# Patient Record
Sex: Female | Born: 1985 | Race: Black or African American | Hispanic: No | Marital: Single | Smoking: Never smoker
Health system: Southern US, Community
[De-identification: ages and names within clinical notes are randomized; demographics above are authoritative.]

## PROBLEM LIST (undated history)

## (undated) ENCOUNTER — Inpatient Hospital Stay (HOSPITAL_COMMUNITY): Payer: Self-pay

## (undated) DIAGNOSIS — F32A Depression, unspecified: Secondary | ICD-10-CM

## (undated) DIAGNOSIS — F53 Postpartum depression: Secondary | ICD-10-CM

## (undated) DIAGNOSIS — R87619 Unspecified abnormal cytological findings in specimens from cervix uteri: Secondary | ICD-10-CM

## (undated) DIAGNOSIS — F329 Major depressive disorder, single episode, unspecified: Secondary | ICD-10-CM

## (undated) DIAGNOSIS — F99 Mental disorder, not otherwise specified: Secondary | ICD-10-CM

## (undated) DIAGNOSIS — I10 Essential (primary) hypertension: Secondary | ICD-10-CM

## (undated) DIAGNOSIS — IMO0002 Reserved for concepts with insufficient information to code with codable children: Secondary | ICD-10-CM

## (undated) DIAGNOSIS — O99345 Other mental disorders complicating the puerperium: Secondary | ICD-10-CM

## (undated) DIAGNOSIS — E0590 Thyrotoxicosis, unspecified without thyrotoxic crisis or storm: Secondary | ICD-10-CM

## (undated) HISTORY — DX: Postpartum depression: F53.0

## (undated) HISTORY — DX: Other mental disorders complicating the puerperium: O99.345

---
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
## 2007-08-08 ENCOUNTER — Emergency Department (HOSPITAL_COMMUNITY): Admission: EM | Admit: 2007-08-08 | Discharge: 2007-08-08 | Payer: Self-pay | Admitting: Emergency Medicine

## 2008-10-17 ENCOUNTER — Ambulatory Visit (HOSPITAL_COMMUNITY): Admission: RE | Admit: 2008-10-17 | Discharge: 2008-10-17 | Payer: Self-pay | Admitting: Obstetrics

## 2008-12-17 ENCOUNTER — Ambulatory Visit (HOSPITAL_COMMUNITY): Admission: RE | Admit: 2008-12-17 | Discharge: 2008-12-17 | Payer: Self-pay | Admitting: Obstetrics

## 2009-01-24 ENCOUNTER — Inpatient Hospital Stay (HOSPITAL_COMMUNITY): Admission: AD | Admit: 2009-01-24 | Discharge: 2009-01-24 | Payer: Self-pay | Admitting: Obstetrics & Gynecology

## 2009-02-12 ENCOUNTER — Inpatient Hospital Stay (HOSPITAL_COMMUNITY): Admission: AD | Admit: 2009-02-12 | Discharge: 2009-02-12 | Payer: Self-pay | Admitting: Obstetrics

## 2009-02-15 ENCOUNTER — Ambulatory Visit (HOSPITAL_COMMUNITY): Admission: RE | Admit: 2009-02-15 | Discharge: 2009-02-15 | Payer: Self-pay | Admitting: Obstetrics

## 2009-02-19 ENCOUNTER — Inpatient Hospital Stay (HOSPITAL_COMMUNITY): Admission: AD | Admit: 2009-02-19 | Discharge: 2009-02-19 | Payer: Self-pay | Admitting: Obstetrics

## 2009-03-07 ENCOUNTER — Inpatient Hospital Stay (HOSPITAL_COMMUNITY): Admission: AD | Admit: 2009-03-07 | Discharge: 2009-03-09 | Payer: Self-pay | Admitting: Obstetrics

## 2009-03-14 ENCOUNTER — Inpatient Hospital Stay (HOSPITAL_COMMUNITY): Admission: RE | Admit: 2009-03-14 | Discharge: 2009-03-18 | Payer: Self-pay | Admitting: Obstetrics & Gynecology

## 2009-03-15 ENCOUNTER — Encounter: Payer: Self-pay | Admitting: Obstetrics

## 2010-01-19 ENCOUNTER — Emergency Department (HOSPITAL_COMMUNITY): Admission: EM | Admit: 2010-01-19 | Discharge: 2010-01-19 | Payer: Self-pay | Admitting: Emergency Medicine

## 2010-04-20 IMAGING — US US OB FOLLOW-UP
1 series · 13 of 13 positions shown · non-contrast
Comparison: none

OBSTETRICAL ULTRASOUND:
 This ultrasound exam was performed in the [HOSPITAL] Ultrasound Department.  The OB US report was generated in the AS system, and faxed to the ordering physician.  This report is also available in [HOSPITAL]?s Access[employer]ware and in [REDACTED] PACS.

[Series 1: us ob follow up · 0.40mm/px · 13 of 13 slices shown]
[im 1/13]
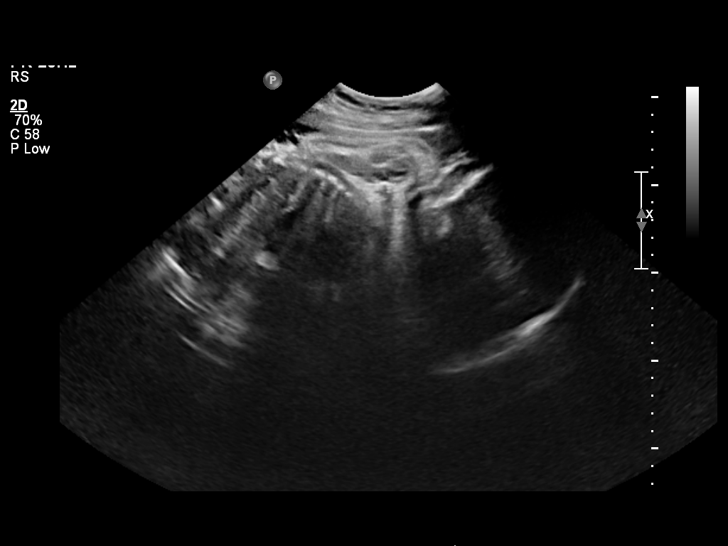
[im 2/13]
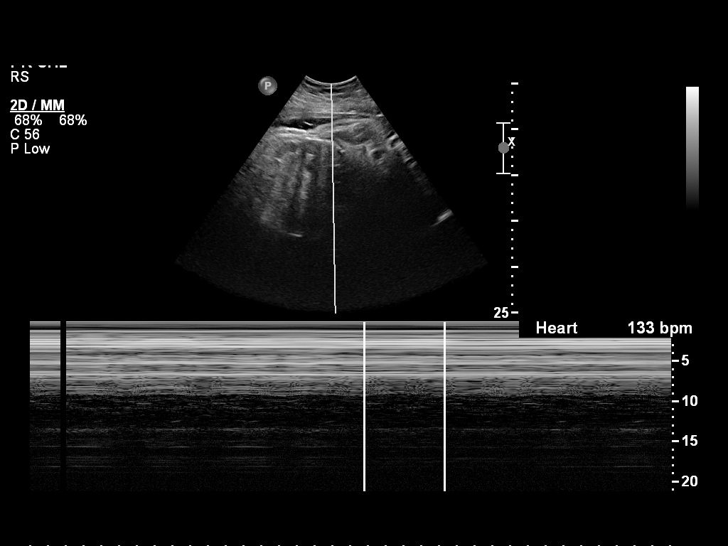
[im 3/13]
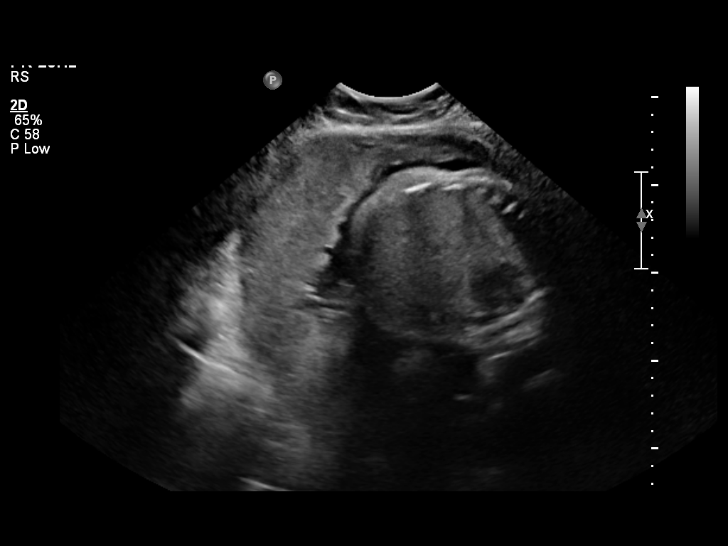
[im 4/13]
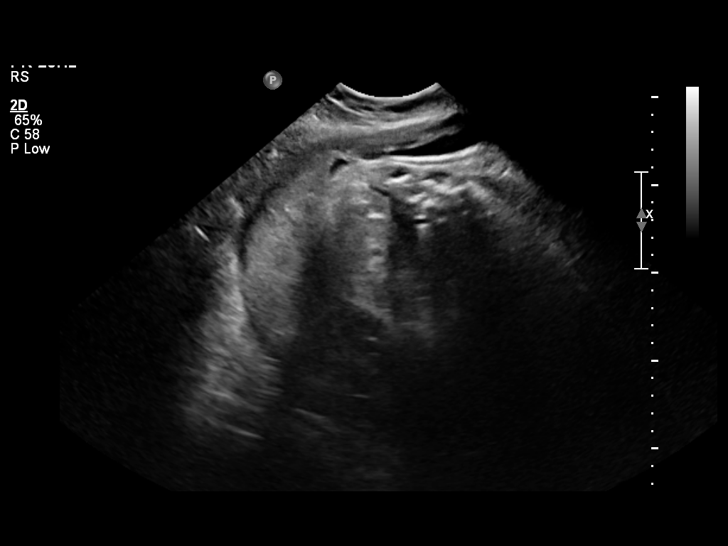
[im 5/13]
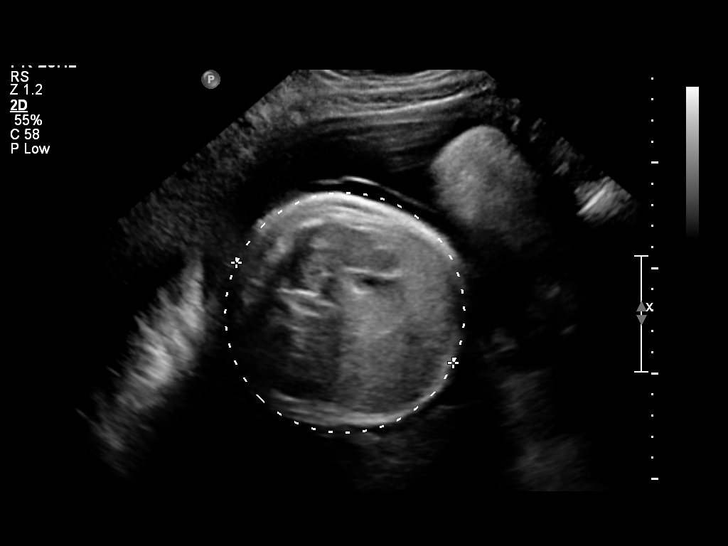
[im 6/13]
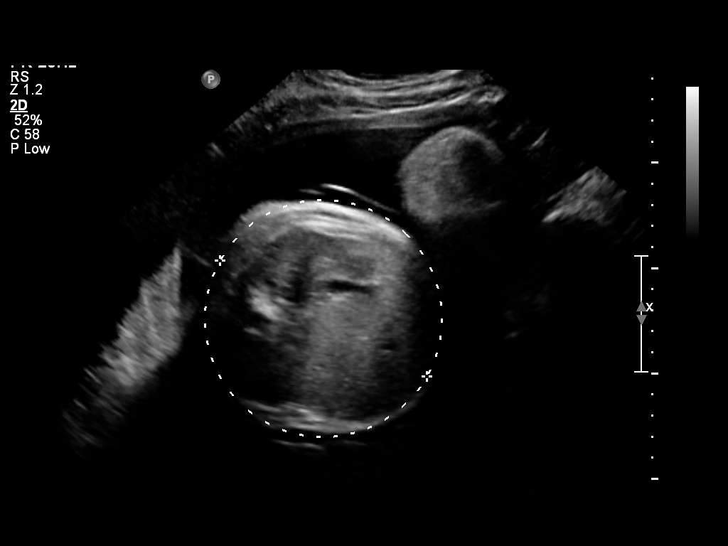
[im 7/13]
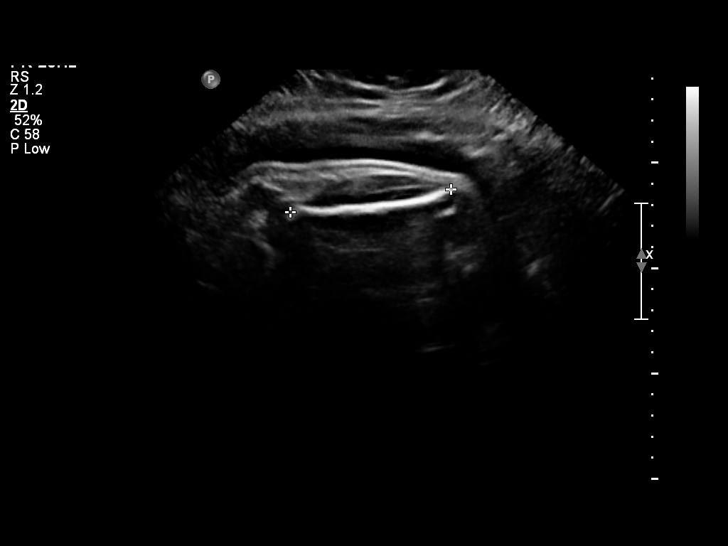
[im 8/13]
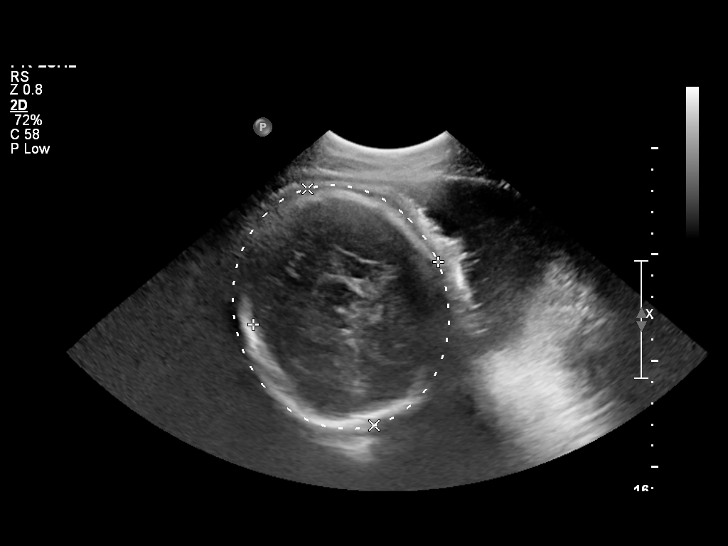
[im 9/13]
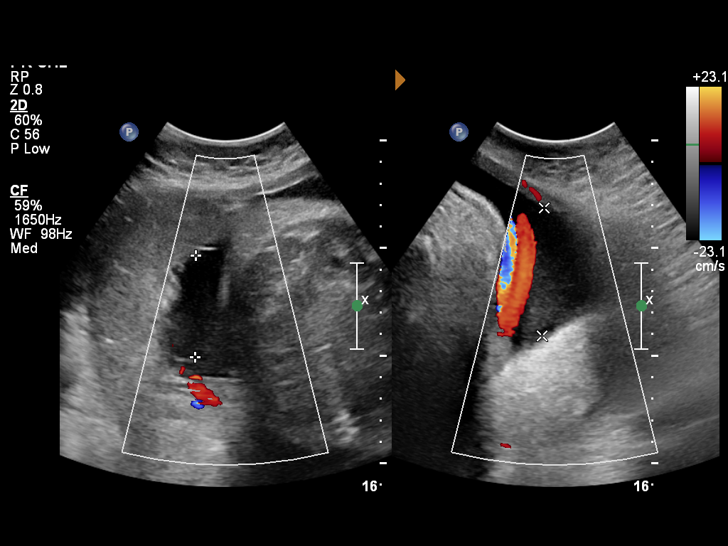
[im 10/13]
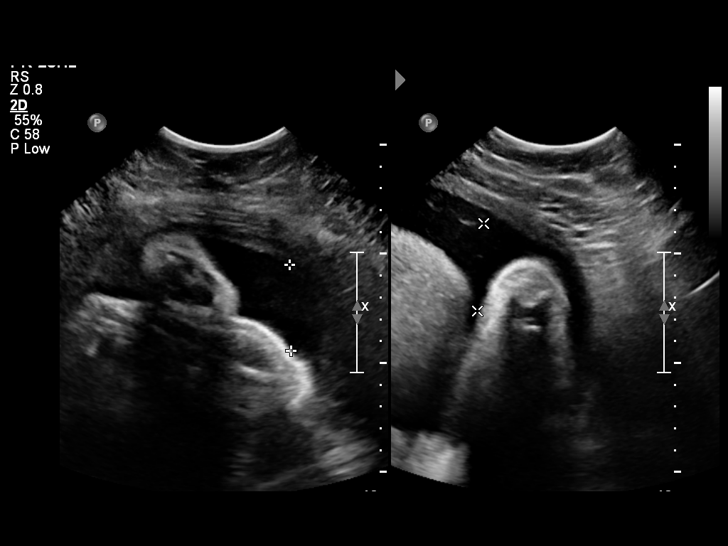
[im 11/13]
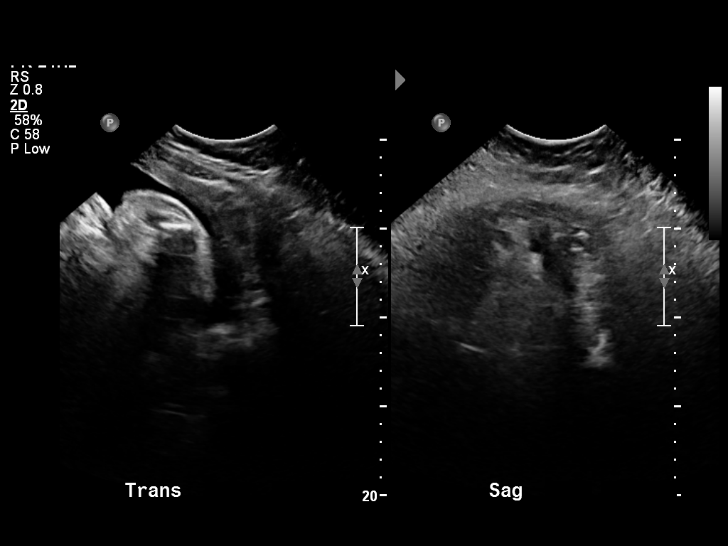
[im 12/13]
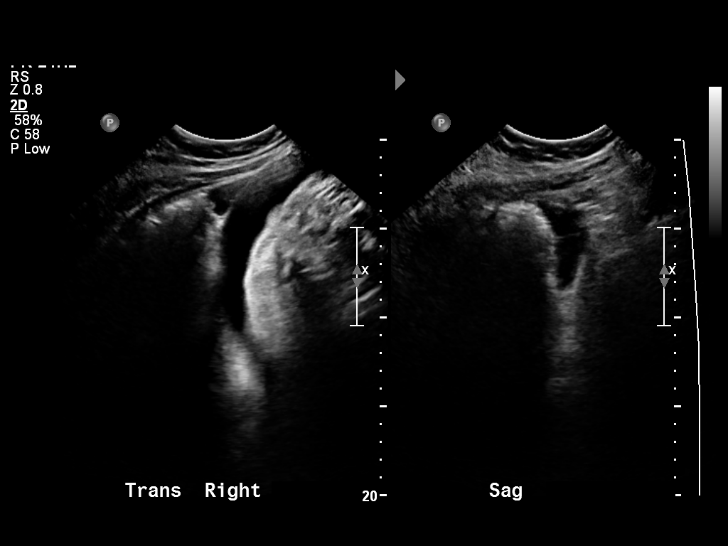
[im 13/13]
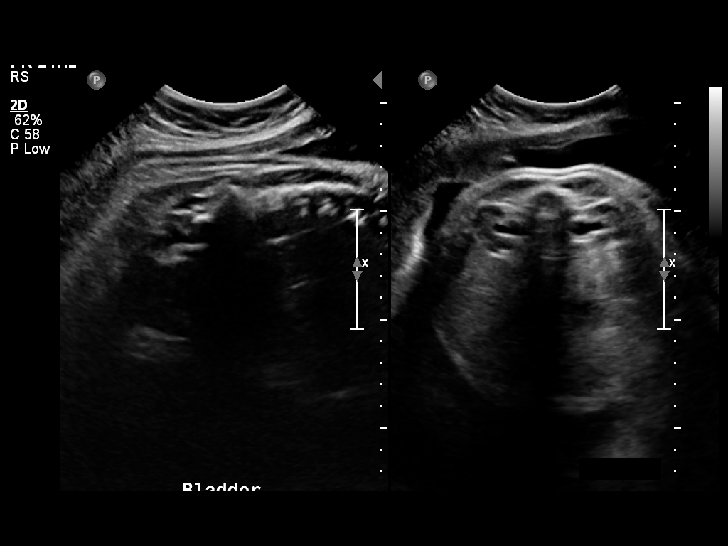

[13 of 13 positions shown; findings below may reference images not displayed]

IMPRESSION: See AS Obstetric US report.

## 2010-07-31 LAB — GLUCOSE, CAPILLARY: Glucose-Capillary: 90 mg/dL (ref 70–99)

## 2010-08-21 LAB — CBC
HCT: 31.5 % — ABNORMAL LOW (ref 36.0–46.0)
HCT: 39.3 % (ref 36.0–46.0)
HCT: 40.3 % (ref 36.0–46.0)
HCT: 40.4 % (ref 36.0–46.0)
Hemoglobin: 10.6 g/dL — ABNORMAL LOW (ref 12.0–15.0)
Hemoglobin: 13.2 g/dL (ref 12.0–15.0)
Hemoglobin: 13.6 g/dL (ref 12.0–15.0)
Hemoglobin: 13.5 g/dL (ref 12.0–15.0)
MCHC: 33.7 g/dL (ref 30.0–36.0)
MCHC: 33.7 g/dL (ref 30.0–36.0)
MCHC: 33.8 g/dL (ref 30.0–36.0)
MCHC: 33.3 g/dL (ref 30.0–36.0)
MCV: 86.9 fL (ref 78.0–100.0)
MCV: 87.1 fL (ref 78.0–100.0)
MCV: 87.9 fL (ref 78.0–100.0)
MCV: 87.2 fL (ref 78.0–100.0)
Platelets: 149 10*3/uL — ABNORMAL LOW (ref 150–400)
Platelets: 193 10*3/uL (ref 150–400)
Platelets: 194 10*3/uL (ref 150–400)
Platelets: 209 10*3/uL (ref 150–400)
RBC: 3.58 MIL/uL — ABNORMAL LOW (ref 3.87–5.11)
RBC: 4.52 MIL/uL (ref 3.87–5.11)
RBC: 4.63 MIL/uL (ref 3.87–5.11)
RBC: 4.64 MIL/uL (ref 3.87–5.11)
RDW: 14.2 % (ref 11.5–15.5)
RDW: 14.3 % (ref 11.5–15.5)
RDW: 14.5 % (ref 11.5–15.5)
RDW: 14.7 % (ref 11.5–15.5)
WBC: 12.8 10*3/uL — ABNORMAL HIGH (ref 4.0–10.5)
WBC: 15.1 10*3/uL — ABNORMAL HIGH (ref 4.0–10.5)
WBC: 17.2 10*3/uL — ABNORMAL HIGH (ref 4.0–10.5)
WBC: 11.1 10*3/uL — ABNORMAL HIGH (ref 4.0–10.5)

## 2010-08-21 LAB — COMPREHENSIVE METABOLIC PANEL
ALT: 13 U/L (ref 0–35)
ALT: 17 U/L (ref 0–35)
AST: 21 U/L (ref 0–37)
AST: 25 U/L (ref 0–37)
Albumin: 2.6 g/dL — ABNORMAL LOW (ref 3.5–5.2)
Albumin: 2.6 g/dL — ABNORMAL LOW (ref 3.5–5.2)
Alkaline Phosphatase: 137 U/L — ABNORMAL HIGH (ref 39–117)
Alkaline Phosphatase: 133 U/L — ABNORMAL HIGH (ref 39–117)
BUN: 4 mg/dL — ABNORMAL LOW (ref 6–23)
BUN: 5 mg/dL — ABNORMAL LOW (ref 6–23)
CO2: 23 mEq/L (ref 19–32)
CO2: 21 mEq/L (ref 19–32)
Calcium: 9.4 mg/dL (ref 8.4–10.5)
Calcium: 9 mg/dL (ref 8.4–10.5)
Chloride: 105 mEq/L (ref 96–112)
Chloride: 105 mEq/L (ref 96–112)
Creatinine, Ser: 0.56 mg/dL (ref 0.4–1.2)
Creatinine, Ser: 0.64 mg/dL (ref 0.4–1.2)
GFR calc Af Amer: >60 mL/min (ref >60)
GFR calc Af Amer: >60 mL/min (ref >60)
GFR calc non Af Amer: >60 mL/min (ref >60)
GFR calc non Af Amer: >60 mL/min (ref >60)
Glucose, Bld: 102 mg/dL — ABNORMAL HIGH (ref 70–99)
Glucose, Bld: 85 mg/dL (ref 70–99)
Potassium: 3.9 mEq/L (ref 3.5–5.1)
Potassium: 3.8 mEq/L (ref 3.5–5.1)
Sodium: 135 mEq/L (ref 135–145)
Sodium: 135 mEq/L (ref 135–145)
Total Bilirubin: 0.5 mg/dL (ref 0.3–1.2)
Total Bilirubin: 0.7 mg/dL (ref 0.3–1.2)
Total Protein: 5.9 g/dL — ABNORMAL LOW (ref 6.0–8.3)
Total Protein: 5.9 g/dL — ABNORMAL LOW (ref 6.0–8.3)

## 2010-08-21 LAB — URINE MICROSCOPIC-ADD ON

## 2010-08-21 LAB — URINALYSIS, ROUTINE W REFLEX MICROSCOPIC
Glucose, UA: NEGATIVE mg/dL
Ketones, ur: >80 mg/dL — AB
Nitrite: NEGATIVE
Protein, ur: 100 mg/dL — AB
Specific Gravity, Urine: 1.025 (ref 1.005–1.030)
Urobilinogen, UA: 1 mg/dL (ref 0.0–1.0)
pH: 6 (ref 5.0–8.0)

## 2010-08-21 LAB — URIC ACID: Uric Acid, Serum: 5.5 mg/dL (ref 2.4–7.0)

## 2010-08-21 LAB — URINALYSIS, DIPSTICK ONLY
Bilirubin Urine: NEGATIVE
Glucose, UA: NEGATIVE mg/dL
Ketones, ur: NEGATIVE mg/dL
Nitrite: NEGATIVE
Protein, ur: NEGATIVE mg/dL
Specific Gravity, Urine: <1.005 — ABNORMAL LOW (ref 1.005–1.030)
Urobilinogen, UA: 0.2 mg/dL (ref 0.0–1.0)
pH: 6.5 (ref 5.0–8.0)

## 2010-08-21 LAB — RPR
RPR Ser Ql: NONREACTIVE
RPR Ser Ql: NONREACTIVE

## 2010-08-21 LAB — LACTATE DEHYDROGENASE: LDH: 156 U/L (ref 94–250)

## 2010-08-22 LAB — URINALYSIS, ROUTINE W REFLEX MICROSCOPIC
Bilirubin Urine: NEGATIVE
Bilirubin Urine: NEGATIVE
Glucose, UA: NEGATIVE mg/dL
Glucose, UA: NEGATIVE mg/dL
Ketones, ur: NEGATIVE mg/dL
Ketones, ur: 15 mg/dL — AB
Nitrite: NEGATIVE
Protein, ur: NEGATIVE mg/dL
Protein, ur: NEGATIVE mg/dL
Specific Gravity, Urine: 1.015 (ref 1.005–1.030)
Urobilinogen, UA: 0.2 mg/dL (ref 0.0–1.0)
pH: 6.5 (ref 5.0–8.0)
pH: 6 (ref 5.0–8.0)

## 2010-08-22 LAB — COMPREHENSIVE METABOLIC PANEL
AST: 24 U/L (ref 0–37)
Albumin: 2.5 g/dL — ABNORMAL LOW (ref 3.5–5.2)
Albumin: 2.4 g/dL — ABNORMAL LOW (ref 3.5–5.2)
Alkaline Phosphatase: 135 U/L — ABNORMAL HIGH (ref 39–117)
Alkaline Phosphatase: 125 U/L — ABNORMAL HIGH (ref 39–117)
BUN: 1 mg/dL — ABNORMAL LOW (ref 6–23)
BUN: 2 mg/dL — ABNORMAL LOW (ref 6–23)
CO2: 20 mEq/L (ref 19–32)
CO2: 22 mEq/L (ref 19–32)
Chloride: 107 mEq/L (ref 96–112)
Chloride: 105 mEq/L (ref 96–112)
Creatinine, Ser: 0.51 mg/dL (ref 0.4–1.2)
Creatinine, Ser: 0.55 mg/dL (ref 0.4–1.2)
GFR calc Af Amer: >60 mL/min (ref >60)
GFR calc non Af Amer: >60 mL/min (ref >60)
GFR calc non Af Amer: >60 mL/min (ref >60)
Glucose, Bld: 130 mg/dL — ABNORMAL HIGH (ref 70–99)
Potassium: 4.3 mEq/L (ref 3.5–5.1)
Potassium: 3.7 mEq/L (ref 3.5–5.1)
Total Bilirubin: 0.2 mg/dL — ABNORMAL LOW (ref 0.3–1.2)
Total Bilirubin: 0.4 mg/dL (ref 0.3–1.2)

## 2010-08-22 LAB — URINE MICROSCOPIC-ADD ON

## 2010-08-22 LAB — CBC
HCT: 39.9 % (ref 36.0–46.0)
HCT: 39.7 % (ref 36.0–46.0)
Hemoglobin: 13.4 g/dL (ref 12.0–15.0)
MCV: 87.3 fL (ref 78.0–100.0)
MCV: 88.1 fL (ref 78.0–100.0)
Platelets: 217 10*3/uL (ref 150–400)
RBC: 4.57 MIL/uL (ref 3.87–5.11)
RBC: 4.51 MIL/uL (ref 3.87–5.11)
WBC: 14.1 10*3/uL — ABNORMAL HIGH (ref 4.0–10.5)
WBC: 13.9 10*3/uL — ABNORMAL HIGH (ref 4.0–10.5)

## 2010-09-04 ENCOUNTER — Emergency Department (HOSPITAL_COMMUNITY)
Admission: EM | Admit: 2010-09-04 | Discharge: 2010-09-04 | Disposition: A | Discharge status: Home / Self Care | Payer: BC Managed Care – PPO | Attending: Emergency Medicine | Admitting: Emergency Medicine

## 2010-09-04 ENCOUNTER — Emergency Department (HOSPITAL_COMMUNITY): Payer: BC Managed Care – PPO

## 2010-09-04 DIAGNOSIS — R0789 Other chest pain: Secondary | ICD-10-CM | POA: Insufficient documentation

## 2010-09-04 LAB — CBC
HCT: 40.2 % (ref 36.0–46.0)
Hemoglobin: 13.6 g/dL (ref 12.0–15.0)
MCHC: 33.8 g/dL (ref 30.0–36.0)

## 2010-09-04 LAB — DIFFERENTIAL
Basophils Absolute: 0 10*3/uL (ref 0.0–0.1)
Lymphocytes Relative: 35 % (ref 12–46)
Monocytes Absolute: 0.7 10*3/uL (ref 0.1–1.0)
Monocytes Relative: 6 % (ref 3–12)
Neutro Abs: 7.6 10*3/uL (ref 1.7–7.7)

## 2010-09-04 LAB — BASIC METABOLIC PANEL
CO2: 25 mEq/L (ref 19–32)
Calcium: 9 mg/dL (ref 8.4–10.5)
Chloride: 105 mEq/L (ref 96–112)
Glucose, Bld: 111 mg/dL — ABNORMAL HIGH (ref 70–99)
Sodium: 137 mEq/L (ref 135–145)

## 2010-09-04 LAB — POCT CARDIAC MARKERS
CKMB, poc: 1.6 ng/mL (ref 1.0–8.0)
Troponin i, poc: <0.05 ng/mL (ref 0.00–0.09)

## 2010-09-04 LAB — URINALYSIS, ROUTINE W REFLEX MICROSCOPIC
Glucose, UA: NEGATIVE mg/dL
Nitrite: NEGATIVE
Protein, ur: NEGATIVE mg/dL
Urobilinogen, UA: 0.2 mg/dL (ref 0.0–1.0)

## 2010-09-04 LAB — POCT PREGNANCY, URINE: Preg Test, Ur: NEGATIVE

## 2011-02-09 LAB — POCT PREGNANCY, URINE: Preg Test, Ur: NEGATIVE

## 2011-02-09 LAB — WET PREP, GENITAL: Clue Cells Wet Prep HPF POC: NONE SEEN

## 2011-02-09 LAB — POCT URINALYSIS DIP (DEVICE)
Hgb urine dipstick: NEGATIVE
Nitrite: NEGATIVE
Protein, ur: NEGATIVE
Specific Gravity, Urine: <=1.005
Urobilinogen, UA: 0.2
pH: 5.5

## 2011-03-05 LAB — POCT PREGNANCY, URINE: Operator id: 270961

## 2011-03-05 LAB — POCT URINALYSIS DIP (DEVICE)
Operator id: 270961
Protein, ur: NEGATIVE
Specific Gravity, Urine: >=1.03
Urobilinogen, UA: 0.2
pH: 5.5

## 2011-03-05 LAB — POCT RAPID STREP A: Streptococcus, Group A Screen (Direct): NEGATIVE

## 2011-04-24 ENCOUNTER — Emergency Department (HOSPITAL_COMMUNITY)
Admission: EM | Admit: 2011-04-24 | Discharge: 2011-04-24 | Discharge status: Against Medical Advice | Payer: Self-pay | Source: Home / Self Care

## 2011-04-25 ENCOUNTER — Emergency Department (HOSPITAL_COMMUNITY)
Admission: EM | Admit: 2011-04-25 | Discharge: 2011-04-25 | Disposition: A | Discharge status: Home / Self Care | Payer: Self-pay | Attending: Emergency Medicine | Admitting: Emergency Medicine

## 2011-04-25 DIAGNOSIS — R1110 Vomiting, unspecified: Secondary | ICD-10-CM | POA: Insufficient documentation

## 2011-04-25 DIAGNOSIS — R059 Cough, unspecified: Secondary | ICD-10-CM | POA: Insufficient documentation

## 2011-04-25 DIAGNOSIS — R509 Fever, unspecified: Secondary | ICD-10-CM | POA: Insufficient documentation

## 2011-04-25 DIAGNOSIS — J111 Influenza due to unidentified influenza virus with other respiratory manifestations: Secondary | ICD-10-CM

## 2011-04-25 DIAGNOSIS — R05 Cough: Secondary | ICD-10-CM | POA: Insufficient documentation

## 2011-04-25 DIAGNOSIS — IMO0001 Reserved for inherently not codable concepts without codable children: Secondary | ICD-10-CM | POA: Insufficient documentation

## 2011-04-25 MED ORDER — IBUPROFEN 800 MG PO TABS
800.0000 mg | ORAL_TABLET | Freq: Once | ORAL | Status: AC
  Administered 2011-04-25: 800 mg via ORAL
  Filled 2011-04-25: qty 1

## 2011-04-25 MED ORDER — IBUPROFEN 600 MG PO TABS: 600.0000 mg | ORAL_TABLET | Freq: Four times a day (QID) | ORAL | Status: AC | PRN

## 2011-04-25 NOTE — ED Notes (Signed)
Pt complains of body aches since yesterday, decreased po intake, fever, chills.

## 2011-04-25 NOTE — ED Provider Notes (Signed)
History     CSN: [id number] Arrival date & time: 04/25/2011  4:34 AM   First MD Initiated Contact with Patient 04/25/11 [phone]      Chief Complaint  Patient presents with  . Generalized Body Aches    (Consider location/radiation/quality/duration/timing/severity/associated sxs/prior treatment) HPI Patient presents with complaint of fever and body aches which began yesterday. She states she took Tylenol which did help somewhat. She has a mild nonproductive cough as well. She's continued to drink fluid. She had one episode of emesis but no diarrhea and has tolerated fluid since then. She has no significant sick contacts. She has no other associated systemic symptoms. She called her physician who requested she come to the ED to find out if she had the flu.  History reviewed. No pertinent past medical history.  History reviewed. No pertinent past surgical history.  History reviewed. No pertinent family history.  History  Substance Use Topics  . Smoking status: Never Smoker   . Smokeless tobacco: Not on file  . Alcohol Use: No    OB History    Grav Para Term Preterm Abortions TAB SAB Ect Mult Living                  Review of Systems ROS reviewed and otherwise negative except for mentioned in HPI  Allergies  Penicillins and Percocet  Home Medications   Current Outpatient Rx  Name Route Sig Dispense Refill  . IBUPROFEN 600 MG PO TABS Oral Take 1 tablet (600 mg total) by mouth every 6 (six) hours as needed for pain. 30 tablet 0    BP 119/75  Pulse 105  Temp(Src) 98.7 F (37.1 C) (Oral)  Resp 20  SpO2 97%  LMP 04/15/2011 Vitals reviewed Physical Exam Physical Examination: General appearance - alert, well appearing, and in no distress Mental status - alert, oriented to person, place, and time Mouth - mucous membranes moist, pharynx normal without lesions Neck - supple, no significant adenopathy Chest - clear to auscultation, no wheezes, rales or rhonchi, symmetric  air entry Heart - normal rate, regular rhythm, normal S1, S2, no murmurs, rubs, clicks or gallops Musculoskeletal - no joint tenderness, deformity or swelling Extremities - peripheral pulses normal, no pedal edema, brisk cap refill Skin - normal coloration and turgor, no rashes  ED Course  Procedures (including critical care time)  Labs Reviewed - No data to display No results found.   1. Influenza-like illness       MDM  Patient presenting with fever and influenza-like illness. She appears well-hydrated and nontoxic. Her vitals are reassuring with the exception of mild tachycardia however patient is well-appearing. Have given her ibuprofen. Explained symptomatic treatment home and the importance of hydration. She was discharged with strict return precautions and is agreeable with this plan the        Ethelda Chick, MD 04/25/11 [phone]

## 2011-08-17 ENCOUNTER — Encounter (HOSPITAL_COMMUNITY): Payer: Self-pay | Admitting: *Deleted

## 2011-08-17 ENCOUNTER — Emergency Department (INDEPENDENT_AMBULATORY_CARE_PROVIDER_SITE_OTHER)
Admission: EM | Admit: 2011-08-17 | Discharge: 2011-08-17 | Disposition: A | Discharge status: Home / Self Care | Payer: Self-pay | Source: Home / Self Care | Attending: Family Medicine | Admitting: Family Medicine

## 2011-08-17 DIAGNOSIS — J029 Acute pharyngitis, unspecified: Secondary | ICD-10-CM

## 2011-08-17 MED ORDER — IBUPROFEN 800 MG PO TABS: 800.0000 mg | ORAL_TABLET | Freq: Three times a day (TID) | ORAL | Status: AC | PRN

## 2011-08-17 MED ORDER — AZITHROMYCIN 250 MG PO TABS: 250.0000 mg | ORAL_TABLET | Freq: Every day | ORAL | Status: AC

## 2011-08-17 NOTE — ED Notes (Signed)
Pt c/o sorethroat onset 3 days ago.  States tonsils swollen.  Denies fever, earache, bodyaches.  Throat red, tonsils swollen and some exudate noted.  Pt has "thick voice".

## 2011-08-17 NOTE — ED Provider Notes (Signed)
History     CSN: [id number]  Arrival date & time 08/17/11  5621   First MD Initiated Contact with Patient 08/17/11 [phone]      Chief Complaint  Patient presents with  . Sore Throat    (Consider location/radiation/quality/duration/timing/severity/associated sxs/prior treatment) HPI Comments: Carrie Massey presents for evaluation of a sore throat, over the last 3 days. She denies any fever. She does report a mildly, productive cough. She does report a change in her voice. She reports a history of strep throat twice in her life. She denies any allergies. She reports pain with swallowing. She does note exudates on her tonsils.  Patient is a 26 y.o. female presenting with pharyngitis. The history is provided by the patient.  Sore Throat This is a new problem. The current episode started more than 2 days ago. The problem occurs constantly. The problem has not changed since onset.The symptoms are aggravated by swallowing, eating and drinking. The symptoms are relieved by nothing. She has tried nothing for the symptoms.    History reviewed. No pertinent past medical history.  Past Surgical History  Procedure Date  . Cesarean section     No family history on file.  History  Substance Use Topics  . Smoking status: Never Smoker   . Smokeless tobacco: Not on file  . Alcohol Use: No    OB History    Grav Para Term Preterm Abortions TAB SAB Ect Mult Living                  Review of Systems  Constitutional: Negative.  Negative for fever and chills.  HENT: Positive for sore throat, trouble swallowing and voice change. Negative for congestion and rhinorrhea.   Eyes: Negative.   Respiratory: Positive for cough.   Cardiovascular: Negative.   Gastrointestinal: Negative.   Genitourinary: Negative.   Musculoskeletal: Negative.   Skin: Negative.   Neurological: Negative.     Allergies  Penicillins and Percocet  Home Medications   Current Outpatient Rx  Name Route Sig Dispense  Refill  . AZITHROMYCIN 250 MG PO TABS Oral Take 1 tablet (250 mg total) by mouth daily. Take two tablets on first day, then one tablet each day for four days 6 tablet 0  . IBUPROFEN 800 MG PO TABS Oral Take 1 tablet (800 mg total) by mouth every 8 (eight) hours as needed for pain or fever. 30 tablet 0    BP 149/106  Pulse 85  Temp(Src) 98.9 F (37.2 C) (Oral)  Resp 14  SpO2 98%  LMP 07/19/2011  Physical Exam  Nursing note and vitals reviewed. Constitutional: She is oriented to person, place, and time. She appears well-developed and well-nourished.  HENT:  Head: Normocephalic and atraumatic.  Right Ear: Tympanic membrane normal.  Left Ear: Tympanic membrane normal.  Mouth/Throat: Uvula is midline and mucous membranes are normal. Oropharyngeal exudate and posterior oropharyngeal erythema present. No tonsillar abscesses.    Eyes: EOM are normal.  Neck: Normal range of motion.  Cardiovascular: Normal rate, regular rhythm, S1 normal, S2 normal and normal heart sounds.   No murmur heard. Pulmonary/Chest: Effort normal and breath sounds normal. She has no wheezes. She has no rhonchi. She has no rales.  Musculoskeletal: Normal range of motion.  Lymphadenopathy:    She has cervical adenopathy.       Right cervical: Superficial cervical and posterior cervical adenopathy present.       Left cervical: Superficial cervical and posterior cervical adenopathy present.  Neurological: She is alert  and oriented to person, place, and time.  Skin: Skin is warm and dry.  Psychiatric: Her behavior is normal.    ED Course  Procedures (including critical care time)   Labs Reviewed  POCT RAPID STREP A ([facility] CARE ONLY)   No results found.   1. Pharyngitis       MDM  Rapid strep throat: negative; 2/4 Centor criteria (tender adenopathy, exudates; no fever, + cough); given delayed rx for azithromycin (she is PCN allergic); fill after 48 to 72 hours if no improvement        Renaee Munda, MD 08/17/11 1003

## 2011-08-17 NOTE — Discharge Instructions (Signed)
Your lab result was negative for strep throat. Both viruses (such as Epstein-Barr which causes mononucleosis) and bacteria of these can cause your symptoms, and can appear very similarly. This is likely viral pharyngitis, given a negative strep screen, and 2/4 Centor criteria. Continue supportive care with ibuprofen 800 mg every 8 hours (or use 4 over the counter tablets of generic ibuprofen, Motrin, Advil) and/or acetaminophen (Tylenol) for fever control and control of pain and inflammation. You may alternate these every 4 hours; for example, if you take acetaminophen at noon, then you can take ibuprofen at 4 pm, then acetaminophen again at 8 pm, etc. Also, encourage aggressive hydration. Return to care should your symptoms not improve, or worsen in any way. Continue conservative measures over the next 48 to 72 hours; if no improvement, fill and complete antibiotic course.

## 2011-10-24 ENCOUNTER — Inpatient Hospital Stay (HOSPITAL_COMMUNITY)
Admission: AD | Admit: 2011-10-24 | Discharge: 2011-10-25 | Disposition: A | Discharge status: Home / Self Care | Payer: BC Managed Care – PPO | Source: Ambulatory Visit | Attending: Obstetrics & Gynecology | Admitting: Obstetrics & Gynecology

## 2011-10-24 ENCOUNTER — Encounter (HOSPITAL_COMMUNITY): Payer: Self-pay

## 2011-10-24 DIAGNOSIS — R112 Nausea with vomiting, unspecified: Secondary | ICD-10-CM

## 2011-10-24 DIAGNOSIS — Z30432 Encounter for removal of intrauterine contraceptive device: Secondary | ICD-10-CM | POA: Insufficient documentation

## 2011-10-24 DIAGNOSIS — R631 Polydipsia: Secondary | ICD-10-CM

## 2011-10-24 DIAGNOSIS — T839XXA Unspecified complication of genitourinary prosthetic device, implant and graft, initial encounter: Secondary | ICD-10-CM

## 2011-10-24 DIAGNOSIS — N83209 Unspecified ovarian cyst, unspecified side: Secondary | ICD-10-CM | POA: Insufficient documentation

## 2011-10-24 DIAGNOSIS — R109 Unspecified abdominal pain: Secondary | ICD-10-CM

## 2011-10-24 DIAGNOSIS — R358 Other polyuria: Secondary | ICD-10-CM

## 2011-10-24 DIAGNOSIS — R3589 Other polyuria: Secondary | ICD-10-CM

## 2011-10-24 HISTORY — DX: Unspecified abnormal cytological findings in specimens from cervix uteri: R87.619

## 2011-10-24 HISTORY — DX: Reserved for concepts with insufficient information to code with codable children: IMO0002

## 2011-10-24 LAB — URINALYSIS, ROUTINE W REFLEX MICROSCOPIC
Bilirubin Urine: NEGATIVE
Glucose, UA: NEGATIVE mg/dL
Hgb urine dipstick: NEGATIVE
Specific Gravity, Urine: >1.03 — ABNORMAL HIGH (ref 1.005–1.030)
Urobilinogen, UA: 0.2 mg/dL (ref 0.0–1.0)

## 2011-10-24 LAB — HCG, QUANTITATIVE, PREGNANCY: hCG, Beta Chain, Quant, S: <1 m[IU]/mL (ref <5)

## 2011-10-24 MED ORDER — KETOROLAC TROMETHAMINE 60 MG/2ML IM SOLN
60.0000 mg | Freq: Once | INTRAMUSCULAR | Status: AC
  Administered 2011-10-24: 60 mg via INTRAMUSCULAR
  Filled 2011-10-24: qty 2

## 2011-10-24 NOTE — MAU Note (Signed)
Increased urinary frequency, decreased appetite & increased thirst x3 weeks.

## 2011-10-24 NOTE — MAU Provider Note (Signed)
Carrie Massey y.o.G2P1011 @Unknown  by LMP Chief Complaint  Patient presents with  . Abdominal Pain     First Provider Initiated Contact with Patient 10/24/11 2230      SUBJECTIVE  HPI: HPI: Carrie Massey is a 26 y.o. year old G30P1011 female who presents to MAU reporting increased urinary frequency, decreased appetite, nausea and increased thirst x 3 weeks, vaginal spotting one weeks ago and cramping and mid LBP x 3-4 days. She denies fever, chills, dysuria, hematuria, flank pain, GI complaints, vaginal discharge, change in partner. She has had a Mirena IUD in place for 2 1/2 years and is concerned that the Mirena may be causing these Sx or that she may be pregnant. She does not have periods while on Mirena. She would like to have the Mirena removed tonight.      Past Medical History  Diagnosis Date  . Abnormal Pap smear     repeat WNL   Past Surgical History  Procedure Date  . Cesarean section    History   Social History  . Marital Status: Single    Spouse Name: N/A    Number of Children: N/A  . Years of Education: N/A   Occupational History  . Not on file.   Social History Main Topics  . Smoking status: Never Smoker   . Smokeless tobacco: Not on file  . Alcohol Use: No  . Drug Use: Yes    Special: Marijuana     occassionally  . Sexually Active: Yes    Birth Control/ Protection: IUD     1 partner   Other Topics Concern  . Not on file   Social History Narrative  . No narrative on file   No current facility-administered medications on file prior to encounter.   Current Outpatient Prescriptions on File Prior to Encounter  Medication Sig Dispense Refill  . levonorgestrel (MIRENA) 20 MCG/24HR IUD 1 each by Intrauterine route once.       Allergies  Allergen Reactions  . Penicillins Hives  . Percocet (Oxycodone-Acetaminophen) Hives    ROS: Pertinent items in HPI  OBJECTIVE Blood pressure 132/59, pulse 81, temperature 98.4 F (36.9 C), temperature  source Oral, resp. rate 18, height 5' 2.5" (1.588 m), weight 103.42 kg (228 lb).  GENERAL: Well-developed, well-nourished female in no acute distress. Anxious. HEENT: Normocephalic, good dentition HEART: normal rate RESP: normal effort ABDOMEN: Soft, non-tender. No CVAT EXTREMITIES: Nontender, no edema NEURO: Alert and oriented SPECULUM EXAM: NEFG, physiologic discharge, no blood noted, cervix clean. Strings visualized.  BIMANUAL: cervix closed; uterus non-tender, normal size; no adnexal tenderness or masses  Procedure: Time-out performed. R/B/I of removal of Mirena reviewed including bleeding, pain and failure of attempt. Counseled pt that there is quick return to fertility after removal.  Strings grasped w/ hemostats. Mirena removed easily. Pt tolerated procedure well. No bleeding.    LAB RESULTS Results for orders placed during the hospital encounter of 10/24/11 (from the past 24 hour(s))  URINALYSIS, ROUTINE W REFLEX MICROSCOPIC     Status: Abnormal   Collection Time   10/24/11  8:42 PM      Component Value Range   Color, Urine YELLOW  YELLOW    APPearance CLEAR  CLEAR    Specific Gravity, Urine >1.030 (*) 1.005 - 1.030    pH 6.0  5.0 - 8.0    Glucose, UA NEGATIVE  NEGATIVE (mg/dL)   Hgb urine dipstick NEGATIVE  NEGATIVE    Bilirubin Urine NEGATIVE  NEGATIVE  Ketones, ur NEGATIVE  NEGATIVE (mg/dL)   Protein, ur NEGATIVE  NEGATIVE (mg/dL)   Urobilinogen, UA 0.2  0.0 - 1.0 (mg/dL)   Nitrite NEGATIVE  NEGATIVE    Leukocytes, UA NEGATIVE  NEGATIVE   POCT PREGNANCY, URINE     Status: Normal   Collection Time   10/24/11  8:52 PM      Component Value Range   Preg Test, Ur NEGATIVE  NEGATIVE   HCG, QUANTITATIVE, PREGNANCY     Status: Normal   Collection Time   10/24/11 10:05 PM      Component Value Range   hCG, Beta Chain, Quant, S <1  <5 (mIU/mL)  ABO/RH     Status: Normal   Collection Time   10/24/11 10:05 PM      Component Value Range   ABO/RH(D) A POS    WET PREP, GENITAL      Status: Abnormal   Collection Time   10/25/11  1:20 AM      Component Value Range   Yeast Wet Prep HPF POC NONE SEEN  NONE SEEN    Trich, Wet Prep NONE SEEN  NONE SEEN    Clue Cells Wet Prep HPF POC FEW (*) NONE SEEN    WBC, Wet Prep HPF POC FEW (*) NONE SEEN    IMAGING US Transvaginal Non-ob  10/25/2011  *RADIOLOGY REPORT*  Clinical Data: Lower abdominal pain, Mirena IUD, history of spontaneous abortion  TRANSABDOMINAL AND TRANSVAGINAL ULTRASOUND OF PELVIS Technique:  Both transabdominal and transvaginal ultrasound examinations of the pelvis were performed. Transabdominal technique was performed for global imaging of the pelvis including uterus, ovaries, adnexal regions, and pelvic cul-de-sac.  Comparison: None.   It was necessary to proceed with endovaginal exam following the transabdominal exam to visualize the endometrium.  Findings:  Uterus: Normal in size and appearance, measuring 9.4 x 3.6 x 5.5 cm.  Endometrium: Normal in thickness and appearance, measuring 7 mm. IUD in satisfactory position.  Right ovary:  Normal appearance/no adnexal mass, measuring 4.3 x 2.8 x 2.8 cm, notable for a 2.5 x 2.4 x 1.8 cm physiologic cyst.  Left ovary: Normal appearance/no adnexal mass, measuring 2.6 x 1.7 x 2.3 cm.  Other findings: No free fluid.  IMPRESSION: Normal study.  No evidence of pelvic mass or other significant abnormality.  IUD in satisfactory position.  Original Report Authenticated By: Charline Bills, M.D.   US Pelvis Complete  10/25/2011  *RADIOLOGY REPORT*  Clinical Data: Lower abdominal pain, Mirena IUD, history of spontaneous abortion  TRANSABDOMINAL AND TRANSVAGINAL ULTRASOUND OF PELVIS Technique:  Both transabdominal and transvaginal ultrasound examinations of the pelvis were performed. Transabdominal technique was performed for global imaging of the pelvis including uterus, ovaries, adnexal regions, and pelvic cul-de-sac.  Comparison: None.   It was necessary to proceed with endovaginal exam  following the transabdominal exam to visualize the endometrium.  Findings:  Uterus: Normal in size and appearance, measuring 9.4 x 3.6 x 5.5 cm.  Endometrium: Normal in thickness and appearance, measuring 7 mm. IUD in satisfactory position.  Right ovary:  Normal appearance/no adnexal mass, measuring 4.3 x 2.8 x 2.8 cm, notable for a 2.5 x 2.4 x 1.8 cm physiologic cyst.  Left ovary: Normal appearance/no adnexal mass, measuring 2.6 x 1.7 x 2.3 cm.  Other findings: No free fluid.  IMPRESSION: Normal study.  No evidence of pelvic mass or other significant abnormality.  IUD in satisfactory position.  Original Report Authenticated By: Charline Bills, M.D.   Pain resoled w/ Toradol.  Declines antiemetics.  ASSESSMENT Removal of IUD per pt request Right physiologic cyst Polydipsia, polyuria  PLAN D/C home Discussed w/ pt that it is unlikely that the Mirena caused these new Sx. Pt verbalized understanding. GC/CT pending F/U w/ PCP for polydipsia and polyuria and if Sx continue May take Ibuprofen PRN for cramping, LBP. Contraceptive info given Follow-up Information    Follow up with REDMON,NOELLE, PA. (As needed if symptoms worsen)         Dorathy Kinsman 10/24/2011 10:49 PM

## 2011-10-24 NOTE — MAU Note (Signed)
Lower abdominal pain & back pain x 4 days. Tried tylenol for pain but vomited up the medicine. IUD in place x 2.5 years. Vaginal spotting x1 week ago. Small amount of White mucous discharge, no odor.

## 2011-10-25 ENCOUNTER — Inpatient Hospital Stay (HOSPITAL_COMMUNITY): Payer: BC Managed Care – PPO

## 2011-10-25 DIAGNOSIS — R358 Other polyuria: Secondary | ICD-10-CM

## 2011-10-25 DIAGNOSIS — R631 Polydipsia: Secondary | ICD-10-CM

## 2011-10-25 LAB — WET PREP, GENITAL: Yeast Wet Prep HPF POC: NONE SEEN

## 2011-10-25 NOTE — Discharge Instructions (Signed)
Ovarian Cyst An ovarian cyst is a sac filled with fluid or blood. This sac is attached to the ovary. Some cysts go away on their own. Other cysts need treatment.  HOME CARE   Only take medicine as told by your doctor.   Follow up with your doctor as told.  GET HELP RIGHT AWAY IF:   You develop sudden pain.   Your belly (abdomen) becomes large or puffy (swollen).   You have a hard time peeing (totally emptying your bladder).   You feel sick most of the time.   You have a temperature by mouth above 102 F (38.9 C), not controlled by medicine.   Your periods are late, not regular, or painful.   Your belly or pelvic pain does not go away.   You have pressure on your bladder.   You have pain during sex.   You feel fullness, pressure, or discomfort in your belly.   You lose weight for no reason.  MAKE SURE YOU:   Understand these instructions.   Will watch your condition.   Will get help right away if you are not doing well or get worse.  Document Released: 10/21/2007 Document Revised: 04/23/2011 Document Reviewed: 04/05/2009 [facility] Patient Information 2012 [place], [place].   Contraception Choices Birth control (contraception) can stop pregnancy from happening. Different types of birth control work in different ways. Some can:  Make the mucus in the cervix thick. This makes it hard for sperm to get into the uterus.   Thin the lining of the uterus. This makes it hard for an egg to attach to the wall of the uterus.   Stop the ovaries from releasing an egg.   Block the sperm from reaching the egg.  Certain types of surgery can stop pregnancy from happening. For women, the sugery closes the fallopian tubes (tubal ligation). For men, the surgery stops sperm from releasing during sex (vasectomy). HORMONAL BIRTH CONTROL Hormonal birth control stops pregnancy by putting hormones into your body. Types of birth control include:  A small tube put under the skin of the upper  arm (implant). The tube can stay in place for 3 years.   Shots given every 3 months.   Pills taken every day or once after sex (intercourse).   Patches that are changed once a week.   A ring put into the vagina (vaginal ring). The ring is left in place for 3 weeks and removed for 1 week. Then, a new ring is put in the vagina.  BARRIER BIRTH CONTROL  Barrier birth control blocks sperm from reaching the egg. Types of birth control include:   A thin covering worn on the penis (female condom) during sex.   A soft, loose covering put into the vagina (female condom) before sex.   A rubber bowl that sits over the cervix (diaphragm). The bowl must be made for you. The bowl is put into the vagina before sex. The bowl is left in place for 6 to 8 hours after sex.   A small, soft cup that fits over the cervix (cervical cap). The cup must be made for you. The cup can be left in place for 48 hours after sex.   A sponge that is put into the vagina before sex.   A chemical that kills or blocks sperm from getting into the cervix and uterus (spermicide). The chemical may be a cream, jelly, foam, or pill.  INTRAUTERINE (IUD) BIRTH CONTROL  IUD birth control is a small,  T-shaped piece of plastic. The plastic is put inside the uterus. There are 2 types of IUD:  Copper IUD. The IUD is covered in copper wire. The copper makes a fluid that kills sperm. It can stay in place for 10 years.   Hormone IUD. The hormone stops pregnancy from happening. It can stay in place for 5 years.  NATURAL FAMILY PLANNING BIRTH CONTROL  Natural family planning means not having sex or using barrier birth control when the woman is fertile. A woman can:  Use a calendar to keep track of when she is fertile.   Use a thermometer to measure her body temperature.  Protect yourself against sexual diseases no matter what type of birth control you use. Talk to your doctor about which type of birth control is best for you. Document  Released: 03/01/2009 Document Revised: 04/23/2011 Document Reviewed: 09/10/2010 [facility] Patient Information 2012 [place], [place].

## 2011-10-26 LAB — GC/CHLAMYDIA PROBE AMP, GENITAL
Chlamydia, DNA Probe: NEGATIVE
GC Probe Amp, Genital: NEGATIVE

## 2011-10-26 NOTE — MAU Provider Note (Signed)
Attestation of Attending Supervision of Advanced Practitioner: Evaluation and management procedures were performed by the OB Fellow/PA/CNM/NP under my supervision and collaboration. Chart reviewed, and agree with management and plan.  Radiah Lubinski, M.D. 10/26/2011 7:51 AM   

## 2011-11-27 ENCOUNTER — Inpatient Hospital Stay (HOSPITAL_COMMUNITY)
Admission: AD | Admit: 2011-11-27 | Discharge: 2011-11-27 | Disposition: A | Discharge status: Home / Self Care | Payer: BC Managed Care – PPO | Source: Ambulatory Visit | Attending: Obstetrics & Gynecology | Admitting: Obstetrics & Gynecology

## 2011-11-27 ENCOUNTER — Encounter (HOSPITAL_COMMUNITY): Payer: Self-pay | Admitting: *Deleted

## 2011-11-27 DIAGNOSIS — N926 Irregular menstruation, unspecified: Secondary | ICD-10-CM

## 2011-11-27 DIAGNOSIS — R110 Nausea: Secondary | ICD-10-CM | POA: Insufficient documentation

## 2011-11-27 DIAGNOSIS — N912 Amenorrhea, unspecified: Secondary | ICD-10-CM | POA: Insufficient documentation

## 2011-11-27 LAB — URINALYSIS, ROUTINE W REFLEX MICROSCOPIC
Bilirubin Urine: NEGATIVE
Glucose, UA: NEGATIVE mg/dL
Hgb urine dipstick: NEGATIVE
Ketones, ur: NEGATIVE mg/dL
Nitrite: NEGATIVE
Specific Gravity, Urine: 1.025 (ref 1.005–1.030)
pH: 6 (ref 5.0–8.0)

## 2011-11-27 LAB — HCG, SERUM, QUALITATIVE: Preg, Serum: NEGATIVE

## 2011-11-27 NOTE — MAU Note (Signed)
Pt states, I've had low back pain a week and half, an I've missed my period. I'm all bloated. I had my mirana taken out 10/25/11, and have had unprotected sex."

## 2011-11-27 NOTE — MAU Provider Note (Signed)
History     CSN: [id number]  Arrival date and time: 11/27/11 1723   First Provider Initiated Contact with Patient 11/27/11 1830      Chief Complaint  Patient presents with  . Back Pain   HPI Carrie Massey is 26 y.o. G2P1011 Unknown weeks presenting with dizziness, nausea and late menses.  Had Mirena IUD X 2.5 years, it was removed for cramping and abnormal October 24, 2011.  Since then has had 1 period 6/11-15.  "I feel like something is not right, like I am pregnant".  Hasn't used contraception since IUD removal.  Last intercourse 3 weeks ago.   States pregnancy test was negative with her last pregnancy and she was [redacted] weeks pregnant.  Asking for blood test.  Denies pain or vaginal bleeding/discharge.      Past Medical History  Diagnosis Date  . Abnormal Pap smear     repeat WNL    Past Surgical History  Procedure Date  . Cesarean section     Family History  Problem Relation Age of Onset  . Anesthesia problems Neg Hx   . Diabetes Father   . Diabetes Paternal Aunt     History  Substance Use Topics  . Smoking status: Never Smoker   . Smokeless tobacco: Not on file  . Alcohol Use: No    Allergies:  Allergies  Allergen Reactions  . Penicillins Hives  . Percocet (Oxycodone-Acetaminophen) Hives    Prescriptions prior to admission  Medication Sig Dispense Refill  . acetaminophen (TYLENOL) 325 MG tablet Take 650 mg by mouth every 6 (six) hours as needed. For pain, back pain      . ibuprofen (ADVIL,MOTRIN) 200 MG tablet Take 200 mg by mouth every 6 (six) hours as needed. For pain        Review of Systems  Constitutional: Negative.   HENT: Negative.   Respiratory: Negative.   Cardiovascular: Negative.   Gastrointestinal: Positive for nausea. Negative for vomiting.  Genitourinary:       + for missed period  Neurological: Positive for dizziness.   Physical Exam   Blood pressure 126/76, pulse 89, temperature 98.9 F (37.2 C), temperature source Oral, resp. rate  20, height 5' 1.75" (1.568 m), weight 103.08 kg (227 lb 4 oz), last menstrual period 10/27/2011.  Physical Exam  Constitutional: She is oriented to person, place, and time. She appears well-developed and well-nourished. No distress.  HENT:  Head: Normocephalic.  Neck: Normal range of motion.  Cardiovascular: Normal rate.   Respiratory: Effort normal.  Genitourinary:       Declined by patient  Neurological: She is alert and oriented to person, place, and time.  Skin: Skin is warm and dry.  Psychiatric: She has a normal mood and affect. Her behavior is normal.   Results for orders placed during the hospital encounter of 11/27/11 (from the past 24 hour(s))  URINALYSIS, ROUTINE W REFLEX MICROSCOPIC     Status: Normal   Collection Time   11/27/11  5:42 PM      Component Value Range   Color, Urine YELLOW  YELLOW   APPearance CLEAR  CLEAR   Specific Gravity, Urine 1.025  1.005 - 1.030   pH 6.0  5.0 - 8.0   Glucose, UA NEGATIVE  NEGATIVE mg/dL   Hgb urine dipstick NEGATIVE  NEGATIVE   Bilirubin Urine NEGATIVE  NEGATIVE   Ketones, ur NEGATIVE  NEGATIVE mg/dL   Protein, ur NEGATIVE  NEGATIVE mg/dL   Urobilinogen, UA 0.2  0.0 - 1.0 mg/dL   Nitrite NEGATIVE  NEGATIVE   Leukocytes, UA NEGATIVE  NEGATIVE  POCT PREGNANCY, URINE     Status: Normal   Collection Time   11/27/11  5:52 PM      Component Value Range   Preg Test, Ur NEGATIVE  NEGATIVE  HCG, SERUM, QUALITATIVE     Status: Normal   Collection Time   11/27/11  6:53 PM      Component Value Range   Preg, Serum NEGATIVE  NEGATIVE   MAU Course  Procedures  MDM Patient states she does not need an exam because she had an exam in June.  I offered, patient declined.  She wants to know if she is pregnant because she has pregnancy like sxs.   Assessment and Plan  A: Missed period     Nausea  negative urine and serum pregnancy tests  P:  Instructed patient to repeat urine pregnancy test at home in 1 week if no period       KEY,EVE  M 11/27/2011, 6:38 PM

## 2011-11-30 NOTE — MAU Provider Note (Signed)
Medical Screening exam and patient care preformed by advanced practice provider.  Agree with the above management.  

## 2012-02-17 ENCOUNTER — Other Ambulatory Visit (HOSPITAL_COMMUNITY): Payer: Self-pay | Admitting: Family Medicine

## 2012-02-17 DIAGNOSIS — E049 Nontoxic goiter, unspecified: Secondary | ICD-10-CM

## 2012-02-25 ENCOUNTER — Ambulatory Visit (HOSPITAL_COMMUNITY)
Admission: RE | Admit: 2012-02-25 | Discharge: 2012-02-25 | Disposition: A | Discharge status: Home / Self Care | Payer: BC Managed Care – PPO | Source: Ambulatory Visit | Attending: Family Medicine | Admitting: Family Medicine

## 2012-02-25 DIAGNOSIS — E049 Nontoxic goiter, unspecified: Secondary | ICD-10-CM

## 2012-02-26 ENCOUNTER — Encounter (HOSPITAL_COMMUNITY)
Admission: RE | Admit: 2012-02-26 | Discharge: 2012-02-26 | Disposition: A | Discharge status: Home / Self Care | Payer: BC Managed Care – PPO | Source: Ambulatory Visit | Attending: Family Medicine | Admitting: Family Medicine

## 2012-02-26 ENCOUNTER — Encounter (HOSPITAL_COMMUNITY): Payer: Self-pay

## 2012-02-26 DIAGNOSIS — E049 Nontoxic goiter, unspecified: Secondary | ICD-10-CM | POA: Insufficient documentation

## 2012-02-26 MED ORDER — SODIUM IODIDE I 131 CAPSULE: 14.7000 | Freq: Once | INTRAVENOUS | Status: AC | PRN

## 2012-02-26 MED ORDER — SODIUM PERTECHNETATE TC 99M INJECTION
10.7000 | Freq: Once | INTRAVENOUS | Status: AC | PRN
  Administered 2012-02-26: 10.7 via INTRAVENOUS

## 2012-05-18 NOTE — L&D Delivery Note (Signed)
Delivery Note At 8:15 PM a viable female was delivered via VBAC, Spontaneous (Presentation: ; Occiput Anterior).    Placenta status: Spontaneous, trailing membranes.  Cord: 3 vessels with the following complications: None.    Anesthesia: Epidural  Episiotomy: None Lacerations: Labial, periurethral Suture Repair: 3.0 vicryl rapide Est. Blood Loss (mL): 200 ml  Mom to postpartum.  Baby to Couplet care / Skin to Skin.  JACKSON-MOORE,Rashidah [place] A 03/24/2013, 9:04 PM

## 2012-07-17 ENCOUNTER — Encounter (HOSPITAL_COMMUNITY): Payer: Self-pay | Admitting: *Deleted

## 2012-07-17 ENCOUNTER — Inpatient Hospital Stay (HOSPITAL_COMMUNITY)
Admission: AD | Admit: 2012-07-17 | Discharge: 2012-07-17 | Disposition: A | Discharge status: Home / Self Care | Payer: BC Managed Care – PPO | Source: Ambulatory Visit | Attending: Obstetrics & Gynecology | Admitting: Obstetrics & Gynecology

## 2012-07-17 ENCOUNTER — Inpatient Hospital Stay (HOSPITAL_COMMUNITY): Payer: BC Managed Care – PPO

## 2012-07-17 DIAGNOSIS — N760 Acute vaginitis: Secondary | ICD-10-CM | POA: Insufficient documentation

## 2012-07-17 DIAGNOSIS — B9689 Other specified bacterial agents as the cause of diseases classified elsewhere: Secondary | ICD-10-CM | POA: Insufficient documentation

## 2012-07-17 DIAGNOSIS — A499 Bacterial infection, unspecified: Secondary | ICD-10-CM | POA: Insufficient documentation

## 2012-07-17 DIAGNOSIS — R109 Unspecified abdominal pain: Secondary | ICD-10-CM | POA: Insufficient documentation

## 2012-07-17 DIAGNOSIS — N831 Corpus luteum cyst of ovary, unspecified side: Secondary | ICD-10-CM | POA: Insufficient documentation

## 2012-07-17 LAB — URINALYSIS, ROUTINE W REFLEX MICROSCOPIC
Glucose, UA: NEGATIVE mg/dL
Leukocytes, UA: NEGATIVE
Nitrite: NEGATIVE
Specific Gravity, Urine: <1.005 — ABNORMAL LOW (ref 1.005–1.030)
pH: 6 (ref 5.0–8.0)

## 2012-07-17 LAB — CBC
Hemoglobin: 14.5 g/dL (ref 12.0–15.0)
MCH: 28.9 pg (ref 26.0–34.0)
MCHC: 34.6 g/dL (ref 30.0–36.0)
Platelets: 221 10*3/uL (ref 150–400)

## 2012-07-17 LAB — WET PREP, GENITAL
Trich, Wet Prep: NONE SEEN
Yeast Wet Prep HPF POC: NONE SEEN

## 2012-07-17 LAB — OB RESULTS CONSOLE GC/CHLAMYDIA
Chlamydia: NEGATIVE
Gonorrhea: NEGATIVE

## 2012-07-17 MED ORDER — METRONIDAZOLE 500 MG PO TABS: 500.0000 mg | ORAL_TABLET | Freq: Two times a day (BID) | ORAL | Status: DC

## 2012-07-17 MED ORDER — IBUPROFEN 800 MG PO TABS: 800.0000 mg | ORAL_TABLET | Freq: Three times a day (TID) | ORAL | Status: DC | PRN

## 2012-07-17 NOTE — MAU Note (Signed)
Pt presents with complaints of abdominal cramping approximately 1 week. State that she had a normal period January 18th and then started again February 9th. States that the cramping has been worse over the last couple of days. Feels nauseated and states she could possibly be pregnant.

## 2012-07-17 NOTE — MAU Provider Note (Signed)
History     CSN: [id number]  Arrival date and time: 07/17/12 4098   None     Chief Complaint  Patient presents with  . Abdominal Cramping   HPI 27 y.o. G2P1011 with low abd cramping x 1 week, irregular periods x last 2-3 months. Trying to get pregnant x 5-6 months, implanon out 6 months ago. Very worried about not being pregnant yet.   Past Medical History  Diagnosis Date  . Abnormal Pap smear     repeat WNL    Past Surgical History  Procedure Laterality Date  . Cesarean section      Family History  Problem Relation Age of Onset  . Anesthesia problems Neg Hx   . Diabetes Father   . Diabetes Paternal Aunt     History  Substance Use Topics  . Smoking status: Never Smoker   . Smokeless tobacco: Not on file  . Alcohol Use: No    Allergies:  Allergies  Allergen Reactions  . Penicillins Hives  . Percocet (Oxycodone-Acetaminophen) Hives    Prescriptions prior to admission  Medication Sig Dispense Refill  . acetaminophen (TYLENOL) 325 MG tablet Take 650 mg by mouth every 6 (six) hours as needed. For pain, back pain        ROS Physical Exam   Blood pressure 126/88, pulse 103, temperature 97.7 F (36.5 C), temperature source Oral, resp. rate 18, height 5\' 4"  (1.626 m), weight 210 lb (95.255 kg), last menstrual period 06/26/2012.  Physical Exam  Nursing note and vitals reviewed. Constitutional: She is oriented to person, place, and time. She appears well-developed and well-nourished. No distress.  HENT:  Head: Normocephalic and atraumatic.  Cardiovascular: Normal rate, regular rhythm and normal heart sounds.   Respiratory: Effort normal and breath sounds normal. No respiratory distress.  GI: Soft. Bowel sounds are normal. She exhibits no distension and no mass. There is no tenderness. There is no rebound and no guarding.  Genitourinary: There is no rash or lesion on the right labia. There is no rash or lesion on the left labia. Uterus is not deviated, not  enlarged, not fixed and not tender. Cervix exhibits motion tenderness. Cervix exhibits no discharge and no friability. Right adnexum displays no mass, no tenderness and no fullness. Left adnexum displays tenderness and fullness. Left adnexum displays no mass. No erythema, tenderness or bleeding around the vagina. Vaginal discharge found.  Neurological: She is alert and oriented to person, place, and time.  Skin: Skin is warm and dry.  Psychiatric: She has a normal mood and affect.    MAU Course  Procedures UPT negative UA negative Wet prep + clue CBC WNL US Transvaginal Non-ob  07/17/2012  *RADIOLOGY REPORT*  Clinical Data: Pelvic pain.  TRANSABDOMINAL AND TRANSVAGINAL ULTRASOUND OF PELVIS  Technique:  Both transabdominal and transvaginal ultrasound examinations of the pelvis were performed including evaluation of the uterus, ovaries, adnexal regions, and pelvic cul-de-sac.  Comparison: 10/25/2011  Findings:  Uterus: 8.0 x 3.9 x 5.3 cm.  Normal echotexture.  No focal abnormality.  Endometrium: Normal appearance and thickness, 9 mm.  Right Ovary: 5.0 x 3.4 x 3.5 cm.  2.7 cm minimally complex cyst in the right ovary, likely hemorrhagic corpus luteal cyst.  No adnexal masses.  Left Ovary: 3.0 x 1.8 x 2.2 cm. Normal size and echotexture.  No adnexal masses.  Other Findings:  Small amount of free fluid in the cul-de-sac, likely physiologic.  IMPRESSION: Small complex right corpus luteal cyst.  No acute findings otherwise.  Original Report Authenticated By: Charlett Nose, M.D.    US Pelvis Complete  07/17/2012  *RADIOLOGY REPORT*  Clinical Data: Pelvic pain.  TRANSABDOMINAL AND TRANSVAGINAL ULTRASOUND OF PELVIS  Technique:  Both transabdominal and transvaginal ultrasound examinations of the pelvis were performed including evaluation of the uterus, ovaries, adnexal regions, and pelvic cul-de-sac.  Comparison: 10/25/2011  Findings:  Uterus: 8.0 x 3.9 x 5.3 cm.  Normal echotexture.  No focal abnormality.   Endometrium: Normal appearance and thickness, 9 mm.  Right Ovary: 5.0 x 3.4 x 3.5 cm.  2.7 cm minimally complex cyst in the right ovary, likely hemorrhagic corpus luteal cyst.  No adnexal masses.  Left Ovary: 3.0 x 1.8 x 2.2 cm. Normal size and echotexture.  No adnexal masses.  Other Findings:  Small amount of free fluid in the cul-de-sac, likely physiologic.  IMPRESSION: Small complex right corpus luteal cyst.  No acute findings otherwise.   Original Report Authenticated By: Charlett Nose, M.D.     Assessment and Plan   1. Corpus luteum cyst   2. BV (bacterial vaginosis)    Discussed normal expectations for cycles, conception F/U for infertility work up if desired if no pregnancy after at least 1 year of trying   Medication List    TAKE these medications       acetaminophen 325 MG tablet  Commonly known as:  TYLENOL  Take 650 mg by mouth every 6 (six) hours as needed. For pain, back pain     ibuprofen 800 MG tablet  Commonly known as:  ADVIL,MOTRIN  Take 1 tablet (800 mg total) by mouth every 8 (eight) hours as needed for pain.     metroNIDAZOLE 500 MG tablet  Commonly known as:  FLAGYL  Take 1 tablet (500 mg total) by mouth 2 (two) times daily.            Follow-up Information   Follow up with [facility]. (make an appointment as needed for follow up)    Contact information:   [address] [place] [address] [phone]        [facility] 07/17/2012, 11:22 AM

## 2012-07-18 LAB — GC/CHLAMYDIA PROBE AMP: CT Probe RNA: NEGATIVE

## 2012-07-27 ENCOUNTER — Encounter (HOSPITAL_COMMUNITY): Payer: Self-pay | Admitting: *Deleted

## 2012-07-27 ENCOUNTER — Inpatient Hospital Stay (HOSPITAL_COMMUNITY)
Admission: AD | Admit: 2012-07-27 | Discharge: 2012-07-27 | Disposition: A | Discharge status: Home / Self Care | Payer: BC Managed Care – PPO | Source: Ambulatory Visit | Attending: Obstetrics and Gynecology | Admitting: Obstetrics and Gynecology

## 2012-07-27 DIAGNOSIS — O210 Mild hyperemesis gravidarum: Secondary | ICD-10-CM | POA: Insufficient documentation

## 2012-07-27 DIAGNOSIS — Z3490 Encounter for supervision of normal pregnancy, unspecified, unspecified trimester: Secondary | ICD-10-CM

## 2012-07-27 DIAGNOSIS — O219 Vomiting of pregnancy, unspecified: Secondary | ICD-10-CM

## 2012-07-27 LAB — URINALYSIS, ROUTINE W REFLEX MICROSCOPIC
Glucose, UA: NEGATIVE mg/dL
Ketones, ur: 40 mg/dL — AB
Leukocytes, UA: NEGATIVE
Protein, ur: NEGATIVE mg/dL

## 2012-07-27 MED ORDER — CONCEPT OB 130-92.4-1 MG PO CAPS: 1.0000 | ORAL_CAPSULE | Freq: Every day | ORAL | Status: DC

## 2012-07-27 NOTE — MAU Provider Note (Signed)
Chief Complaint: Possible Pregnancy, Vomiting and Nausea  First Carlton Sweaney Initiated Contact with Patient 07/27/12 1806     SUBJECTIVE HPI: Carrie Massey is a 27 y.o. G3P1011 at 105w3d by LMP who presents with spitting and vomiting small amounts. MAU visit 3/2 for same. UPT, [place], Wet prep, CGC/CT neg. Pt was TTC.    Past Medical History  Diagnosis Date  . Abnormal Pap smear     repeat WNL   OB History   Grav Para Term Preterm Abortions TAB SAB Ect Mult Living   3 1 1  1  1   1      # Outc Date GA Lbr Len/2nd Wgt Sex Del Anes PTL Lv   1 TRM 10/10    F LTCS   Yes   Comments: macrosomia   2 CUR            3 SAB              Past Surgical History  Procedure Laterality Date  . Cesarean section     History   Social History  . Marital Status: Single    Spouse Name: N/A    Number of Children: N/A  . Years of Education: N/A   Occupational History  . Not on file.   Social History Main Topics  . Smoking status: Never Smoker   . Smokeless tobacco: Not on file  . Alcohol Use: No  . Drug Use: Yes    Special: Marijuana     Comment: occassionally  . Sexually Active: Yes    Birth Control/ Protection: None     Comment: 1 partner, last intercourse 07/05/12   Other Topics Concern  . Not on file   Social History Narrative  . No narrative on file   No current facility-administered medications on file prior to encounter.   Current Outpatient Prescriptions on File Prior to Encounter  Medication Sig Dispense Refill  . acetaminophen (TYLENOL) 325 MG tablet Take 325 mg by mouth every 8 (eight) hours as needed for pain.        Allergies  Allergen Reactions  . Penicillins Hives  . Percocet (Oxycodone-Acetaminophen) Hives    ROS: Pertinent items in HPI  OBJECTIVE Blood pressure 132/70, pulse 91, temperature 98.1 F (36.7 C), temperature source Oral, resp. rate 18, height 5\' 2"  (1.575 m), weight 95.981 kg (211 lb 9.6 oz), last menstrual period 06/26/2012, SpO2 100.00%. GENERAL:  Well-developed, well-nourished female in no acute distress.  HEENT: Normocephalic HEART: normal rate RESP: normal effort ABDOMEN: Soft, non-tender EXTREMITIES: Nontender, no edema NEURO: Alert and oriented SPECULUM EXAM: Deferred  LAB RESULTS Results for orders placed during the hospital encounter of 07/27/12 (from the past 24 hour(s))  URINALYSIS, ROUTINE W REFLEX MICROSCOPIC     Status: Abnormal   Collection Time    07/27/12  5:15 PM      Result Value Range   Color, Urine YELLOW  YELLOW   APPearance CLEAR  CLEAR   Specific Gravity, Urine 1.020  1.005 - 1.030   pH 6.5  5.0 - 8.0   Glucose, UA NEGATIVE  NEGATIVE mg/dL   Hgb urine dipstick NEGATIVE  NEGATIVE   Bilirubin Urine NEGATIVE  NEGATIVE   Ketones, ur 40 (*) NEGATIVE mg/dL   Protein, ur NEGATIVE  NEGATIVE mg/dL   Urobilinogen, UA 1.0  0.0 - 1.0 mg/dL   Nitrite NEGATIVE  NEGATIVE   Leukocytes, UA NEGATIVE  NEGATIVE  POCT PREGNANCY, URINE     Status: Abnormal  Collection Time    07/27/12  5:26 PM      Result Value Range   Preg Test, Ur POSITIVE (*) NEGATIVE    IMAGING NA  MAU COURSE  ASSESSMENT 1. Nausea and vomiting of pregnancy, antepartum   2. Pregnancy with uncertain dates    PLAN Discharge home. Plan dating [place] at office.     Follow-up Information   Follow up with [facility]. (2 1/2 weeks)    Contact information:   [address] [place] [address] [phone]      Follow up with [facility].       Medication List    TAKE these medications       acetaminophen 325 MG tablet  Commonly known as:  TYLENOL  Take 325 mg by mouth every 8 (eight) hours as needed for pain.     CONCEPT OB 130-92.4-1 MG Caps  Take 1 tablet by mouth daily.       [place], [place] 07/27/2012  6:29 PM

## 2012-07-27 NOTE — MAU Note (Signed)
Patient states she has been having nausea with some vomiting for the past week, worse today. No pain. Denies bleeding

## 2012-07-27 NOTE — MAU Note (Signed)
States she is spitting and vomiting small amounts here and there. "Food does not taste right." Was seen here for the same thing 3/2, had full exam, pelvic, U/S. Was not pregnant. States she had a negative HPT.

## 2012-08-02 NOTE — MAU Provider Note (Signed)
Attestation of Attending Supervision of Advanced Practitioner: Evaluation and management procedures were performed by the PA/NP/CNM/OB Fellow under my supervision/collaboration. Chart reviewed and agree with management and plan.  FERGUSON,JOHN V 08/02/2012 3:06 PM    

## 2012-08-09 ENCOUNTER — Inpatient Hospital Stay (HOSPITAL_COMMUNITY)
Admission: AD | Admit: 2012-08-09 | Discharge: 2012-08-09 | Disposition: A | Discharge status: Home / Self Care | Payer: BC Managed Care – PPO | Source: Ambulatory Visit | Attending: Family Medicine | Admitting: Family Medicine

## 2012-08-09 ENCOUNTER — Encounter (HOSPITAL_COMMUNITY): Payer: Self-pay | Admitting: *Deleted

## 2012-08-09 ENCOUNTER — Inpatient Hospital Stay (HOSPITAL_COMMUNITY): Payer: BC Managed Care – PPO

## 2012-08-09 DIAGNOSIS — K5900 Constipation, unspecified: Secondary | ICD-10-CM | POA: Insufficient documentation

## 2012-08-09 DIAGNOSIS — O99891 Other specified diseases and conditions complicating pregnancy: Secondary | ICD-10-CM | POA: Insufficient documentation

## 2012-08-09 LAB — CBC WITH DIFFERENTIAL/PLATELET
Basophils Relative: 0 % (ref 0–1)
Eosinophils Absolute: 0.4 10*3/uL (ref 0.0–0.7)
HCT: 38.3 % (ref 36.0–46.0)
Hemoglobin: 13.3 g/dL (ref 12.0–15.0)
MCH: 29.1 pg (ref 26.0–34.0)
MCHC: 34.7 g/dL (ref 30.0–36.0)
Monocytes Absolute: 0.8 10*3/uL (ref 0.1–1.0)
Monocytes Relative: 6 % (ref 3–12)
Neutrophils Relative %: 60 % (ref 43–77)

## 2012-08-09 MED ORDER — FLEET ENEMA 7-19 GM/118ML RE ENEM
1.0000 | ENEMA | Freq: Once | RECTAL | Status: AC
  Administered 2012-08-09: 1 via RECTAL

## 2012-08-09 NOTE — MAU Note (Signed)
Pt states here for constipation, no bm x1 week. Denies bleeding, notes white mucusy vaginal discharge.

## 2012-08-09 NOTE — MAU Note (Signed)
Pt states she hasnt had a bowel movement since 08/02/2012

## 2012-08-09 NOTE — MAU Note (Signed)
No spontaneous BM X 1 week. States her mom gave her a fleets enema 2 days ago and she had very minimal results. States she feels a lot of abdominal cramping because of this.

## 2012-08-09 NOTE — MAU Provider Note (Signed)
Chart reviewed and agree with management and plan.  

## 2012-08-09 NOTE — Progress Notes (Signed)
Pt states she did fall in the snow

## 2012-08-09 NOTE — MAU Provider Note (Signed)
History     CSN: [id number]  Arrival date and time: 08/09/12 1702   None     Chief Complaint  Patient presents with  . Constipation   HPI Carrie Massey is a 27 y.o. @ [redacted]w[redacted]d gestation who presents to MAU with abdominal pain. The pain started 3 days ago. She describes the pain as pressure in the abdomen. She has a problem with constipation. Last BM was about 5 days ago and was a very small amount of hard stool. Has had 2 previous visits here and is scheduled for follow up ultrasound on Monday. Last ultrasound showed no IUP and probably a CLC on the right.  Denies vaginal bleeding or discharge. The history was provided by the patient.  OB History   Grav Para Term Preterm Abortions TAB SAB Ect Mult Living   3 1 1  1  1   1       Past Medical History  Diagnosis Date  . Abnormal Pap smear     repeat WNL    Past Surgical History  Procedure Laterality Date  . Cesarean section      Family History  Problem Relation Age of Onset  . Anesthesia problems Neg Hx   . Diabetes Father   . Diabetes Paternal Aunt     History  Substance Use Topics  . Smoking status: Never Smoker   . Smokeless tobacco: Not on file  . Alcohol Use: No    Allergies:  Allergies  Allergen Reactions  . Penicillins Hives  . Percocet (Oxycodone-Acetaminophen) Hives    Prescriptions prior to admission  Medication Sig Dispense Refill  . Prenat w/o A Vit-FeFum-FePo-FA (CONCEPT OB) 130-92.4-1 MG CAPS Take 1 tablet by mouth daily.      . [DISCONTINUED] Prenat w/o A Vit-FeFum-FePo-FA (CONCEPT OB) 130-92.4-1 MG CAPS Take 1 tablet by mouth daily.  30 capsule  12    Review of Systems  Constitutional: Negative for fever, chills and weight loss.  Eyes: Negative for blurred vision.  Respiratory: Positive for cough. Negative for shortness of breath.   Cardiovascular: Negative for chest pain and palpitations.  Gastrointestinal: Positive for abdominal pain and constipation. Negative for nausea and vomiting.   Genitourinary: Positive for frequency. Negative for dysuria and urgency.  Musculoskeletal: Positive for back pain.  Skin: Negative for itching and rash.  Neurological: Negative for seizures and headaches.  Psychiatric/Behavioral: Negative for depression and substance abuse. The patient is not nervous/anxious.    Physical Exam   Blood pressure 128/69, pulse 88, temperature 98.1 F (36.7 C), temperature source Oral, resp. rate 16, height 5' 2.5" (1.588 m), weight 217 lb (98.431 kg), last menstrual period 06/26/2012.  Physical Exam  Nursing note and vitals reviewed. Constitutional: She is oriented to person, place, and time. She appears well-developed and well-nourished. No distress.  HENT:  Head: Normocephalic and atraumatic.  Eyes: EOM are normal.  Neck: Neck supple.  Cardiovascular: Normal rate.   Respiratory: Effort normal.  GI: Soft.  Minimal tenderness LLQ without guarding or rebound.  Genitourinary: Rectal exam shows no mass and no tenderness.  Hard stool palpated.  Musculoskeletal: Normal range of motion.  Neurological: She is alert and oriented to person, place, and time.  Skin: Skin is warm and dry.  Psychiatric: She has a normal mood and affect. Her behavior is normal. Judgment and thought content normal.    MAU Course  Procedures US Ob Transvaginal  08/09/2012  *RADIOLOGY REPORT*  Clinical Data:  OBSTETRIC <14 WK ULTRASOUND  Technique:  Transvaginal ultrasound was performed for evaluation of the gestation as well as the maternal uterus and adnexal regions.  Comparison:  07/17/2012  Intrauterine gestational sac: Visualized/normal in shape. Yolk sac: Present Embryo: Present Cardiac Activity: Present Heart Rate: 126 bpm  CRL:  4.5 mm  six w  two d          [place] EDC: 04/02/2013.  Maternal uterus/Adnexae: No subchorionic hemorrhage. Right-sided corpus luteum cyst. Normal left ovary. No free pelvic fluid collections.  IMPRESSION: Single living intrauterine embryo estimated at 6  weeks and 2 days gestation. No subchorionic hemorrhage.   Original Report Authenticated By: Rudie Meyer, M.D.     Results for orders placed during the hospital encounter of 08/09/12 (from the past 24 hour(s))  CBC WITH DIFFERENTIAL     Status: Abnormal   Collection Time    08/09/12  5:30 PM      Result Value Range   WBC 14.0 (*) 4.0 - 10.5 K/uL   RBC 4.57  3.87 - 5.11 MIL/uL   Hemoglobin 13.3  12.0 - 15.0 g/dL   HCT 16.1  09.6 - 04.5 %   MCV 83.8  78.0 - 100.0 fL   MCH 29.1  26.0 - 34.0 pg   MCHC 34.7  30.0 - 36.0 g/dL   RDW 40.9  81.1 - 91.4 %   Platelets 241  150 - 400 K/uL   Neutrophils Relative 60  43 - 77 %   Neutro Abs 8.4 (*) 1.7 - 7.7 K/uL   Lymphocytes Relative 31  12 - 46 %   Lymphs Abs 4.3 (*) 0.7 - 4.0 K/uL   Monocytes Relative 6  3 - 12 %   Monocytes Absolute 0.8  0.1 - 1.0 K/uL   Eosinophils Relative 3  0 - 5 %   Eosinophils Absolute 0.4  0.0 - 0.7 K/uL   Basophils Relative 0  0 - 1 %   Basophils Absolute 0.0  0.0 - 0.1 K/uL  HCG, QUANTITATIVE, PREGNANCY     Status: Abnormal   Collection Time    08/09/12  5:30 PM      Result Value Range   hCG, Beta Chain, Quant, S 15073 (*) <5 mIU/mL    19:45 pm Patient without pain after good results from Fleet enema.   Assessment: 27 y.o. female @ [redacted]w[redacted]d gestation with constipation  Plan:  High Fiber diet   Start prenatal care.   Return as needed MDM Patient without pain and stable for discharge. She will start her prenatal care. She will return here as needed for problems.  Ghina Bittinger, RN, FNP, [facility] 08/09/2012, 6:32 PM

## 2012-08-15 ENCOUNTER — Ambulatory Visit (HOSPITAL_COMMUNITY)
Admission: RE | Admit: 2012-08-15 | Discharge: 2012-08-15 | Disposition: A | Discharge status: Home / Self Care | Payer: BC Managed Care – PPO | Source: Ambulatory Visit | Attending: Advanced Practice Midwife | Admitting: Advanced Practice Midwife

## 2012-08-15 DIAGNOSIS — Z3490 Encounter for supervision of normal pregnancy, unspecified, unspecified trimester: Secondary | ICD-10-CM

## 2012-09-09 ENCOUNTER — Inpatient Hospital Stay (HOSPITAL_COMMUNITY)
Admission: AD | Admit: 2012-09-09 | Discharge: 2012-09-09 | Disposition: A | Discharge status: Home / Self Care | Payer: BC Managed Care – PPO | Source: Ambulatory Visit | Attending: Obstetrics | Admitting: Obstetrics

## 2012-09-09 DIAGNOSIS — G4700 Insomnia, unspecified: Secondary | ICD-10-CM | POA: Insufficient documentation

## 2012-09-09 DIAGNOSIS — R55 Syncope and collapse: Secondary | ICD-10-CM

## 2012-09-09 DIAGNOSIS — O99891 Other specified diseases and conditions complicating pregnancy: Secondary | ICD-10-CM | POA: Insufficient documentation

## 2012-09-09 DIAGNOSIS — R42 Dizziness and giddiness: Secondary | ICD-10-CM | POA: Insufficient documentation

## 2012-09-09 DIAGNOSIS — O2650 Maternal hypotension syndrome, unspecified trimester: Secondary | ICD-10-CM | POA: Insufficient documentation

## 2012-09-09 LAB — CBC
HCT: 37.4 % (ref 36.0–46.0)
MCHC: 34.8 g/dL (ref 30.0–36.0)
MCV: 83.3 fL (ref 78.0–100.0)
RDW: 13.3 % (ref 11.5–15.5)

## 2012-09-09 LAB — URINALYSIS, ROUTINE W REFLEX MICROSCOPIC
Bilirubin Urine: NEGATIVE
Glucose, UA: NEGATIVE mg/dL
Hgb urine dipstick: NEGATIVE
Ketones, ur: NEGATIVE mg/dL
Protein, ur: NEGATIVE mg/dL

## 2012-09-09 LAB — GLUCOSE, CAPILLARY: Glucose-Capillary: 96 mg/dL (ref 70–99)

## 2012-09-09 NOTE — MAU Provider Note (Signed)
Chief Complaint: Dizziness and Fatigue   First Provider Initiated Contact with Patient 09/09/12 1923     SUBJECTIVE HPI: Carrie Massey is a 27 y.o. G3P1011 at [redacted]w[redacted]d by LMP who presents due dizziness at work today and intermittently for a week. Does not feel weak or dizzy now. When dizzy she feels room is spinning. Episodes last several seconds and occur when upright. Denies ENT sx. Or visual disturbance other than seeing spots once. No lateralizing neurologic sx. No previous episodes presyncope or fainting. Nausea earlier in pregnancy has resolved. Feels tired all the time. She sometimes goes for several hours without eating.Has insomnia, waking in the night for over a week.  Concerned she did not get BV treated diagnosed 07/17/12 because she could not afford the med. Not having vaginal irritation or malodor at present. Has NOB at [facility] in 4 days.   Pertinent Past Medical History: [place] Pertinent OB/GYN History: C/S, LGA 10#1oz, SAB Medications: prenatal vitamins     Filed Vitals:   09/09/12 1652  BP: 116/64  Pulse: 87  Temp: 98.6 F (37 C)  Resp: 20     PHYSICAL EXAM General: WN, WD pleasant female in NAD Coronary: RRR without murmur or gallop Lungs: CTA bilaterally Abdomen: soft, nontender,obese; DT FHT 170 Neuro: grossly intact   Lab Results Results for orders placed during the hospital encounter of 09/09/12 (from the past 24 hour(s))  CBC     Status: Abnormal   Collection Time    09/09/12  4:14 PM      Result Value Range   WBC 13.1 (*) 4.0 - 10.5 K/uL   RBC 4.49  3.87 - 5.11 MIL/uL   Hemoglobin 13.0  12.0 - 15.0 g/dL   HCT 16.1  09.6 - 04.5 %   MCV 83.3  78.0 - 100.0 fL   MCH 29.0  26.0 - 34.0 pg   MCHC 34.8  30.0 - 36.0 g/dL   RDW 40.9  81.1 - 91.4 %   Platelets 242  150 - 400 K/uL  URINALYSIS, ROUTINE W REFLEX MICROSCOPIC     Status: None   Collection Time    09/09/12  5:14 PM      Result Value Range   Color, Urine YELLOW  YELLOW   APPearance CLEAR  CLEAR    Specific Gravity, Urine 1.025  1.005 - 1.030   pH 6.5  5.0 - 8.0   Glucose, UA NEGATIVE  NEGATIVE mg/dL   Hgb urine dipstick NEGATIVE  NEGATIVE   Bilirubin Urine NEGATIVE  NEGATIVE   Ketones, ur NEGATIVE  NEGATIVE mg/dL   Protein, ur NEGATIVE  NEGATIVE mg/dL   Urobilinogen, UA 0.2  0.0 - 1.0 mg/dL   Nitrite NEGATIVE  NEGATIVE   Leukocytes, UA NEGATIVE  NEGATIVE  GLUCOSE, CAPILLARY     Status: None   Collection Time    09/09/12  7:42 PM      Result Value Range   Glucose-Capillary 96  70 - 99 mg/dL  RCBG = 96   ASSESSMENT G3P1011 [redacted]w[redacted]d viable IUP  1. Near syncope   2. Insomnia    PLAN Discharge home with AVS on insomnia and near syncope. Advised to keep hydrated, eat snacks, sit and lower head if feeling dizzy Discussed measures to alleviate insomnia   Medication List    TAKE these medications       prenatal multivitamin Tabs  Take 1 tablet by mouth daily at 12 noon.       Follow-up Information   Follow  up with [facility].   Contact information:   [address] [place] [address] [phone]      Follow up with [facility] In 4 days.   Contact information:   [address] [place] [address] [phone]    Keep NOB appointment 09/13/12. Repeat WP then and treat if indicated.    Danae Orleans, CNM 09/09/2012  7:25 PM

## 2012-09-09 NOTE — MAU Note (Signed)
Patient states she has had dizziness on and off for about one week. Has extreme weakness. Was given medication for BV but was unable to get the medication and continues to have a vaginal discharge with odor. Denies bleeding. Nausea, no vomiting. Denies pain.

## 2012-09-13 ENCOUNTER — Ambulatory Visit (INDEPENDENT_AMBULATORY_CARE_PROVIDER_SITE_OTHER): Payer: BC Managed Care – PPO | Admitting: Obstetrics

## 2012-09-13 ENCOUNTER — Encounter: Payer: Self-pay | Admitting: Obstetrics

## 2012-09-13 VITALS — BP 118/73 | Temp 97.8°F | Wt 217.0 lb

## 2012-09-13 DIAGNOSIS — Z3201 Encounter for pregnancy test, result positive: Secondary | ICD-10-CM

## 2012-09-13 LAB — POCT URINALYSIS DIPSTICK
Ketones, UA: NEGATIVE
Protein, UA: NEGATIVE
Spec Grav, UA: 1.01
pH, UA: 7.5

## 2012-09-13 NOTE — Progress Notes (Signed)
Pulse-85 Pt reports was diagnosed with BV x 1 month ago and has not been able to start medication.   Subjective:    Carrie Massey is being seen today for her first obstetrical visit.  This is not a planned pregnancy. She is at [redacted]w[redacted]d gestation. Her obstetrical history is significant for obesity. Relationship with FOB: significant other, not living together. Patient does intend to breast feed. Pregnancy history fully reviewed.  Menstrual History: OB History   Grav Para Term Preterm Abortions TAB SAB Ect Mult Living   3 1 1  1  1   1       Menarche age: 63  Patient's last menstrual period was 06/26/2012.    The following portions of the patient's history were reviewed and updated as appropriate: allergies, current medications, past family history, past medical history, past social history, past surgical history and problem list.  Review of Systems Pertinent items are noted in HPI.    Objective:    General appearance: alert and no distress Abdomen: normal findings: soft, non-tender Pelvic: cervix normal in appearance, external genitalia normal, no adnexal masses or tenderness, no cervical motion tenderness, uterus normal size, shape, and consistency and vagina normal without discharge    Assessment:    Pregnancy at [redacted]w[redacted]d weeks    Plan:    Initial labs drawn. Prenatal vitamins. Problem list reviewed and updated. AFP3 discussed: requested. Role of ultrasound in pregnancy discussed; fetal survey: requested. Amniocentesis discussed: not indicated. Follow up in 4 weeks. 50% of 20 min visit spent on counseling and coordination of care.

## 2012-09-14 LAB — OBSTETRIC PANEL
Antibody Screen: NEGATIVE
Basophils Absolute: 0 10*3/uL (ref 0.0–0.1)
HCT: 39.9 % (ref 36.0–46.0)
Hemoglobin: 13.6 g/dL (ref 12.0–15.0)
Lymphocytes Relative: 31 % (ref 12–46)
Lymphs Abs: 3.6 10*3/uL (ref 0.7–4.0)
MCV: 84.9 fL (ref 78.0–100.0)
Monocytes Absolute: 0.7 10*3/uL (ref 0.1–1.0)
Monocytes Relative: 6 % (ref 3–12)
Neutro Abs: 7 10*3/uL (ref 1.7–7.7)
RBC: 4.7 MIL/uL (ref 3.87–5.11)
Rh Type: POSITIVE
Rubella: 1.25 Index — ABNORMAL HIGH (ref <0.90)
WBC: 11.6 10*3/uL — ABNORMAL HIGH (ref 4.0–10.5)

## 2012-09-14 LAB — HIV ANTIBODY (ROUTINE TESTING W REFLEX): HIV: NONREACTIVE

## 2012-09-14 LAB — VITAMIN D 25 HYDROXY (VIT D DEFICIENCY, FRACTURES): Vit D, 25-Hydroxy: 31 ng/mL (ref 30–89)

## 2012-09-15 ENCOUNTER — Encounter: Payer: Self-pay | Admitting: Obstetrics

## 2012-09-15 LAB — HEMOGLOBINOPATHY EVALUATION: Hgb F Quant: 0 % (ref 0.0–2.0)

## 2012-09-22 LAB — CULTURE, OB URINE: Colony Count: >=100000

## 2012-09-26 ENCOUNTER — Other Ambulatory Visit: Payer: Self-pay | Admitting: *Deleted

## 2012-09-26 MED ORDER — FLUCONAZOLE 150 MG PO TABS: 150.0000 mg | ORAL_TABLET | Freq: Once | ORAL | Status: DC

## 2012-09-26 MED ORDER — CLINDAMYCIN HCL 300 MG PO CAPS: 300.0000 mg | ORAL_CAPSULE | Freq: Four times a day (QID) | ORAL | Status: AC

## 2012-10-11 ENCOUNTER — Ambulatory Visit (INDEPENDENT_AMBULATORY_CARE_PROVIDER_SITE_OTHER): Payer: BC Managed Care – PPO | Admitting: Obstetrics

## 2012-10-11 VITALS — BP 114/73 | Temp 98.7°F | Wt 221.0 lb

## 2012-10-11 DIAGNOSIS — Z3482 Encounter for supervision of other normal pregnancy, second trimester: Secondary | ICD-10-CM

## 2012-10-11 DIAGNOSIS — Z3480 Encounter for supervision of other normal pregnancy, unspecified trimester: Secondary | ICD-10-CM

## 2012-10-11 DIAGNOSIS — F329 Major depressive disorder, single episode, unspecified: Secondary | ICD-10-CM

## 2012-10-11 MED ORDER — BUPROPION HCL ER (SR) 100 MG PO TB12: ORAL_TABLET | ORAL | Status: DC

## 2012-10-11 NOTE — Progress Notes (Signed)
Pulse: 89

## 2012-10-12 ENCOUNTER — Encounter: Payer: Self-pay | Admitting: Obstetrics

## 2012-10-12 DIAGNOSIS — F329 Major depressive disorder, single episode, unspecified: Secondary | ICD-10-CM | POA: Insufficient documentation

## 2012-10-12 LAB — AFP, QUAD SCREEN
Age Alone: 1:1030 {titer}
INH: 120.1 pg/mL
MoM for hCG: 0.5
Osb Risk: 1:34300 {titer}
Tri 18 Scr Risk Est: NEGATIVE
Trisomy 18 (Edward) Syndrome Interp.: 1:1630 {titer}

## 2012-10-22 ENCOUNTER — Inpatient Hospital Stay (HOSPITAL_COMMUNITY)
Admission: AD | Admit: 2012-10-22 | Discharge: 2012-10-22 | Disposition: A | Discharge status: Home / Self Care | Payer: BC Managed Care – PPO | Source: Ambulatory Visit | Attending: Obstetrics & Gynecology | Admitting: Obstetrics & Gynecology

## 2012-10-22 ENCOUNTER — Encounter (HOSPITAL_COMMUNITY): Payer: Self-pay

## 2012-10-22 DIAGNOSIS — O9989 Other specified diseases and conditions complicating pregnancy, childbirth and the puerperium: Secondary | ICD-10-CM

## 2012-10-22 DIAGNOSIS — M545 Low back pain, unspecified: Secondary | ICD-10-CM | POA: Insufficient documentation

## 2012-10-22 DIAGNOSIS — O26892 Other specified pregnancy related conditions, second trimester: Secondary | ICD-10-CM

## 2012-10-22 DIAGNOSIS — O99891 Other specified diseases and conditions complicating pregnancy: Secondary | ICD-10-CM | POA: Insufficient documentation

## 2012-10-22 DIAGNOSIS — R51 Headache: Secondary | ICD-10-CM | POA: Insufficient documentation

## 2012-10-22 HISTORY — DX: Mental disorder, not otherwise specified: F99

## 2012-10-22 LAB — URINALYSIS, ROUTINE W REFLEX MICROSCOPIC
Leukocytes, UA: NEGATIVE
Protein, ur: NEGATIVE mg/dL
Urobilinogen, UA: 0.2 mg/dL (ref 0.0–1.0)

## 2012-10-22 MED ORDER — CYCLOBENZAPRINE HCL 10 MG PO TABS: 10.0000 mg | ORAL_TABLET | Freq: Three times a day (TID) | ORAL | Status: DC | PRN

## 2012-10-22 MED ORDER — CYCLOBENZAPRINE HCL 10 MG PO TABS
10.0000 mg | ORAL_TABLET | Freq: Once | ORAL | Status: AC
  Administered 2012-10-22: 10 mg via ORAL
  Filled 2012-10-22: qty 1

## 2012-10-22 NOTE — MAU Provider Note (Signed)
  History     CSN: [id number]  Arrival date and time: 10/22/12 4098   First Provider Initiated Contact with Patient 10/22/12 2020      Chief Complaint  Patient presents with  . Headache   HPI  Pt is a G3P1011 at 16.6 wks IUP here with report of frontal headache that started yesterday morning.  Pain is described as throbbing.  +nausea, +photophobia.  Also reports lower back pain that radiates to front pelvis.  No report of vaginal bleeding or leaking of fluid.    Past Medical History  Diagnosis Date  . Abnormal Pap smear     repeat WNL  . Mental disorder     Depression    Past Surgical History  Procedure Laterality Date  . Cesarean section      Family History  Problem Relation Age of Onset  . Anesthesia problems Neg Hx   . Diabetes Father   . Diabetes Paternal Aunt     History  Substance Use Topics  . Smoking status: Never Smoker   . Smokeless tobacco: Never Used  . Alcohol Use: No    Allergies:  Allergies  Allergen Reactions  . Penicillins Hives  . Percocet (Oxycodone-Acetaminophen) Hives    Prescriptions prior to admission  Medication Sig Dispense Refill  . buPROPion (WELLBUTRIN SR) 100 MG 12 hr tablet Take 1 tablet daily for 7 days, then take 2 tablets daily thereafter.  37 tablet  11  . fluconazole (DIFLUCAN) 150 MG tablet Take 1 tablet (150 mg total) by mouth once.  1 tablet  2  . Prenatal Vit-Fe Fumarate-FA (PRENATAL MULTIVITAMIN) TABS Take 1 tablet by mouth daily at 12 noon.        Review of Systems  Constitutional: Negative for fever and chills.  Eyes: Positive for blurred vision and photophobia. Negative for double vision.  Respiratory: Negative.   Cardiovascular: Negative.   Gastrointestinal: Positive for nausea and abdominal pain (pelvis). Negative for vomiting.  Genitourinary: Negative.   Musculoskeletal: Positive for back pain.  Neurological: Negative for dizziness.   Physical Exam   Blood pressure 120/56, pulse 96, temperature 98.5 F  (36.9 C), resp. rate 18, height 5' 2.5" (1.588 m), weight 97.523 kg (215 lb), last menstrual period 06/26/2012.  Physical Exam  Constitutional: She is oriented to person, place, and time. She appears well-developed and well-nourished. No distress.  HENT:  Head: Normocephalic.  Eyes: Pupils are equal, round, and reactive to light.  Neck: Normal range of motion. Neck supple.  Cardiovascular: Normal rate, regular rhythm and normal heart sounds.  Exam reveals no gallop and no friction rub.   No murmur heard. Respiratory: Effort normal and breath sounds normal. No respiratory distress.  GI: Soft. There is no tenderness. There is no CVA tenderness.  Genitourinary: Cervix exhibits no motion tenderness and no discharge. No bleeding around the vagina.  Musculoskeletal: Normal range of motion. She exhibits no edema.  Neurological: She is alert and oriented to person, place, and time.  Skin: Skin is warm and dry.  Psychiatric: She has a normal mood and affect.    MAU Course  Procedures  Flexeril 10 mg PO > pt reports improvement in headache and back pain.  Assessment and Plan  Headache in Pregnancy Back pain in Pregnancy  Plan: DC to home RX Flexeril Keep scheduled appt.  [facility] 10/22/2012, 8:22 PM

## 2012-10-22 NOTE — MAU Note (Signed)
Patient presents with headache for two days.

## 2012-10-22 NOTE — MAU Note (Signed)
Patient also c/o back pain as well, has been taking Tylenol for headache not helping.

## 2012-11-08 ENCOUNTER — Encounter: Payer: Self-pay | Admitting: Obstetrics

## 2012-11-08 ENCOUNTER — Ambulatory Visit (INDEPENDENT_AMBULATORY_CARE_PROVIDER_SITE_OTHER): Payer: BC Managed Care – PPO | Admitting: Obstetrics

## 2012-11-08 VITALS — BP 135/80 | Temp 98.0°F | Wt 218.4 lb

## 2012-11-08 DIAGNOSIS — Z3482 Encounter for supervision of other normal pregnancy, second trimester: Secondary | ICD-10-CM

## 2012-11-08 DIAGNOSIS — B373 Candidiasis of vulva and vagina: Secondary | ICD-10-CM

## 2012-11-08 DIAGNOSIS — Z369 Encounter for antenatal screening, unspecified: Secondary | ICD-10-CM

## 2012-11-08 DIAGNOSIS — Z3480 Encounter for supervision of other normal pregnancy, unspecified trimester: Secondary | ICD-10-CM

## 2012-11-08 LAB — POCT URINALYSIS DIPSTICK
Nitrite, UA: NEGATIVE
pH, UA: 5

## 2012-11-08 MED ORDER — FLUCONAZOLE 150 MG PO TABS: 150.0000 mg | ORAL_TABLET | Freq: Once | ORAL | Status: DC

## 2012-11-08 NOTE — Progress Notes (Signed)
Pulse-100 Pt c/o lower back pain x 2 weeks. Pt c/o "yeast infection", vaginal itching x 3 days.

## 2012-11-15 ENCOUNTER — Ambulatory Visit (INDEPENDENT_AMBULATORY_CARE_PROVIDER_SITE_OTHER): Payer: BC Managed Care – PPO

## 2012-11-15 DIAGNOSIS — Z369 Encounter for antenatal screening, unspecified: Secondary | ICD-10-CM

## 2012-11-16 ENCOUNTER — Encounter (HOSPITAL_COMMUNITY): Payer: Self-pay

## 2012-11-16 ENCOUNTER — Inpatient Hospital Stay (HOSPITAL_COMMUNITY)
Admission: AD | Admit: 2012-11-16 | Discharge: 2012-11-16 | Disposition: A | Discharge status: Home / Self Care | Payer: BC Managed Care – PPO | Source: Ambulatory Visit | Attending: Obstetrics & Gynecology | Admitting: Obstetrics & Gynecology

## 2012-11-16 DIAGNOSIS — O9989 Other specified diseases and conditions complicating pregnancy, childbirth and the puerperium: Secondary | ICD-10-CM

## 2012-11-16 DIAGNOSIS — N949 Unspecified condition associated with female genital organs and menstrual cycle: Secondary | ICD-10-CM

## 2012-11-16 DIAGNOSIS — R109 Unspecified abdominal pain: Secondary | ICD-10-CM | POA: Insufficient documentation

## 2012-11-16 DIAGNOSIS — R102 Pelvic and perineal pain: Secondary | ICD-10-CM

## 2012-11-16 DIAGNOSIS — O99891 Other specified diseases and conditions complicating pregnancy: Secondary | ICD-10-CM | POA: Insufficient documentation

## 2012-11-16 LAB — URINE MICROSCOPIC-ADD ON

## 2012-11-16 LAB — URINALYSIS, ROUTINE W REFLEX MICROSCOPIC
Hgb urine dipstick: NEGATIVE
Ketones, ur: >80 mg/dL — AB
Nitrite: NEGATIVE
pH: 6 (ref 5.0–8.0)
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
## 2012-11-16 NOTE — MAU Note (Signed)
Patient is in with a new onset abdominal cramping for 3 hours. She denies vaginal bleeding or abnormal discharge. She states that she had all negative std screen a few weeks ago. Her abdomen is soft and non-tender. Patient reports fetal movement

## 2012-11-17 ENCOUNTER — Encounter: Payer: Self-pay | Admitting: Obstetrics

## 2012-11-17 LAB — US OB DETAIL + 14 WK

## 2012-11-21 ENCOUNTER — Encounter: Payer: Self-pay | Admitting: Obstetrics

## 2012-11-21 LAB — US OB DETAIL + 14 WK

## 2012-12-08 ENCOUNTER — Other Ambulatory Visit: Payer: BC Managed Care – PPO

## 2012-12-08 ENCOUNTER — Encounter: Payer: Self-pay | Admitting: Obstetrics

## 2012-12-08 ENCOUNTER — Ambulatory Visit (INDEPENDENT_AMBULATORY_CARE_PROVIDER_SITE_OTHER): Payer: BC Managed Care – PPO | Admitting: Obstetrics

## 2012-12-08 VITALS — BP 122/74 | Temp 98.2°F | Wt 225.0 lb

## 2012-12-08 DIAGNOSIS — Z3482 Encounter for supervision of other normal pregnancy, second trimester: Secondary | ICD-10-CM

## 2012-12-08 DIAGNOSIS — Z3480 Encounter for supervision of other normal pregnancy, unspecified trimester: Secondary | ICD-10-CM

## 2012-12-08 LAB — POCT URINALYSIS DIPSTICK
Blood, UA: NEGATIVE
Ketones, UA: NEGATIVE
pH, UA: 6

## 2012-12-08 NOTE — Progress Notes (Signed)
Pulse: 97

## 2012-12-09 LAB — CBC
MCH: 28.7 pg (ref 26.0–34.0)
MCHC: 33.9 g/dL (ref 30.0–36.0)
MCV: 84.7 fL (ref 78.0–100.0)
Platelets: 224 10*3/uL (ref 150–400)
RDW: 14.1 % (ref 11.5–15.5)

## 2012-12-09 LAB — GLUCOSE TOLERANCE, 2 HOURS W/ 1HR: Glucose, Fasting: 66 mg/dL — ABNORMAL LOW (ref 70–99)

## 2012-12-21 ENCOUNTER — Encounter: Payer: BC Managed Care – PPO | Admitting: Obstetrics

## 2012-12-22 ENCOUNTER — Encounter: Payer: Self-pay | Admitting: Obstetrics

## 2012-12-22 ENCOUNTER — Ambulatory Visit (INDEPENDENT_AMBULATORY_CARE_PROVIDER_SITE_OTHER): Payer: BC Managed Care – PPO | Admitting: Obstetrics

## 2012-12-22 VITALS — BP 102/59 | Temp 98.1°F | Wt 228.0 lb

## 2012-12-22 DIAGNOSIS — Z3482 Encounter for supervision of other normal pregnancy, second trimester: Secondary | ICD-10-CM

## 2012-12-22 DIAGNOSIS — Z3480 Encounter for supervision of other normal pregnancy, unspecified trimester: Secondary | ICD-10-CM

## 2012-12-22 LAB — POCT URINALYSIS DIPSTICK
Bilirubin, UA: NEGATIVE
Ketones, UA: NEGATIVE
Protein, UA: NEGATIVE
Spec Grav, UA: 1.025
pH, UA: 5

## 2012-12-22 NOTE — Progress Notes (Signed)
Pulse-99 Pt c/o vaginal itching 1 week. Pt reports did take Diflucan yesterday.

## 2012-12-23 ENCOUNTER — Encounter: Payer: Self-pay | Admitting: Obstetrics

## 2013-01-05 ENCOUNTER — Encounter: Payer: BC Managed Care – PPO | Admitting: Obstetrics

## 2013-01-06 ENCOUNTER — Encounter: Payer: Self-pay | Admitting: Advanced Practice Midwife

## 2013-01-06 ENCOUNTER — Ambulatory Visit (INDEPENDENT_AMBULATORY_CARE_PROVIDER_SITE_OTHER): Payer: BC Managed Care – PPO | Admitting: Advanced Practice Midwife

## 2013-01-06 VITALS — BP 120/77 | Temp 98.0°F | Wt 227.8 lb

## 2013-01-06 DIAGNOSIS — Z3482 Encounter for supervision of other normal pregnancy, second trimester: Secondary | ICD-10-CM

## 2013-01-06 DIAGNOSIS — B951 Streptococcus, group B, as the cause of diseases classified elsewhere: Secondary | ICD-10-CM

## 2013-01-06 DIAGNOSIS — O2390 Unspecified genitourinary tract infection in pregnancy, unspecified trimester: Secondary | ICD-10-CM

## 2013-01-06 DIAGNOSIS — Z98891 History of uterine scar from previous surgery: Secondary | ICD-10-CM

## 2013-01-06 DIAGNOSIS — Z9889 Other specified postprocedural states: Secondary | ICD-10-CM

## 2013-01-06 DIAGNOSIS — N390 Urinary tract infection, site not specified: Secondary | ICD-10-CM

## 2013-01-06 DIAGNOSIS — E669 Obesity, unspecified: Secondary | ICD-10-CM

## 2013-01-06 DIAGNOSIS — Z3480 Encounter for supervision of other normal pregnancy, unspecified trimester: Secondary | ICD-10-CM

## 2013-01-06 LAB — POCT URINALYSIS DIPSTICK
Bilirubin, UA: NEGATIVE
Blood, UA: NEGATIVE
Glucose, UA: NEGATIVE
Leukocytes, UA: NEGATIVE
pH, UA: 6

## 2013-01-06 NOTE — Progress Notes (Signed)
Pulse: 106

## 2013-01-06 NOTE — Progress Notes (Signed)
Doing well. Drinking 1-2 20 oz orange sodas daily. Reports walking. Plans to [facility]. Desires Nexplanon PP. Considering depo prior to dc if insurance will cover it. Patient desires TOLAC.   Encouraged patient to eat healthy diet, exercise and stop soda intake in efforts to help prevent macrosomia.  Educate patient of TOLAC NV.  Derrica Sieg Wilson Singer CNM

## 2013-01-19 ENCOUNTER — Encounter: Payer: Self-pay | Admitting: Obstetrics

## 2013-01-19 ENCOUNTER — Ambulatory Visit (INDEPENDENT_AMBULATORY_CARE_PROVIDER_SITE_OTHER): Payer: BC Managed Care – PPO | Admitting: Obstetrics

## 2013-01-19 VITALS — BP 125/80 | Temp 98.5°F | Wt 229.0 lb

## 2013-01-19 DIAGNOSIS — O3663X1 Maternal care for excessive fetal growth, third trimester, fetus 1: Secondary | ICD-10-CM

## 2013-01-19 DIAGNOSIS — Z3480 Encounter for supervision of other normal pregnancy, unspecified trimester: Secondary | ICD-10-CM

## 2013-01-19 DIAGNOSIS — O3660X0 Maternal care for excessive fetal growth, unspecified trimester, not applicable or unspecified: Secondary | ICD-10-CM | POA: Insufficient documentation

## 2013-01-19 DIAGNOSIS — Z3482 Encounter for supervision of other normal pregnancy, second trimester: Secondary | ICD-10-CM

## 2013-01-19 LAB — POCT URINALYSIS DIPSTICK
Bilirubin, UA: NEGATIVE
Leukocytes, UA: NEGATIVE
Spec Grav, UA: 1.025
pH, UA: 5

## 2013-01-19 NOTE — Progress Notes (Signed)
Pulse: 103

## 2013-01-25 ENCOUNTER — Ambulatory Visit (INDEPENDENT_AMBULATORY_CARE_PROVIDER_SITE_OTHER): Payer: BC Managed Care – PPO | Admitting: Obstetrics

## 2013-01-25 ENCOUNTER — Ambulatory Visit (INDEPENDENT_AMBULATORY_CARE_PROVIDER_SITE_OTHER): Payer: BC Managed Care – PPO

## 2013-01-25 ENCOUNTER — Encounter: Payer: Self-pay | Admitting: Obstetrics

## 2013-01-25 VITALS — BP 120/71 | Temp 98.4°F | Wt 231.4 lb

## 2013-01-25 DIAGNOSIS — Z3480 Encounter for supervision of other normal pregnancy, unspecified trimester: Secondary | ICD-10-CM

## 2013-01-25 DIAGNOSIS — Z3483 Encounter for supervision of other normal pregnancy, third trimester: Secondary | ICD-10-CM

## 2013-01-25 DIAGNOSIS — O3663X1 Maternal care for excessive fetal growth, third trimester, fetus 1: Secondary | ICD-10-CM

## 2013-01-25 DIAGNOSIS — O3660X0 Maternal care for excessive fetal growth, unspecified trimester, not applicable or unspecified: Secondary | ICD-10-CM

## 2013-01-25 LAB — POCT URINALYSIS DIPSTICK
Bilirubin, UA: NEGATIVE
Blood, UA: NEGATIVE
Ketones, UA: NEGATIVE
Nitrite, UA: NEGATIVE
pH, UA: 6

## 2013-01-25 NOTE — Progress Notes (Signed)
Pulse: 93

## 2013-01-26 ENCOUNTER — Encounter: Payer: Self-pay | Admitting: Obstetrics & Gynecology

## 2013-01-26 LAB — US OB DETAIL + 14 WK

## 2013-02-08 ENCOUNTER — Encounter: Payer: Self-pay | Admitting: Obstetrics

## 2013-02-08 ENCOUNTER — Ambulatory Visit (INDEPENDENT_AMBULATORY_CARE_PROVIDER_SITE_OTHER): Payer: BC Managed Care – PPO | Admitting: Obstetrics

## 2013-02-08 VITALS — BP 104/70 | Temp 98.6°F | Wt 235.0 lb

## 2013-02-08 DIAGNOSIS — Z3480 Encounter for supervision of other normal pregnancy, unspecified trimester: Secondary | ICD-10-CM

## 2013-02-08 DIAGNOSIS — Z3483 Encounter for supervision of other normal pregnancy, third trimester: Secondary | ICD-10-CM

## 2013-02-08 LAB — POCT URINALYSIS DIPSTICK
Bilirubin, UA: NEGATIVE
Glucose, UA: NEGATIVE
Leukocytes, UA: NEGATIVE
Nitrite, UA: NEGATIVE
Urobilinogen, UA: NEGATIVE

## 2013-02-08 NOTE — Progress Notes (Signed)
P 102 Patient states she has had some intermittent contractions, but nothing regular

## 2013-02-09 ENCOUNTER — Encounter: Payer: BC Managed Care – PPO | Admitting: Obstetrics

## 2013-02-23 ENCOUNTER — Encounter (HOSPITAL_COMMUNITY): Payer: Self-pay | Admitting: *Deleted

## 2013-02-23 ENCOUNTER — Inpatient Hospital Stay (HOSPITAL_COMMUNITY)
Admission: AD | Admit: 2013-02-23 | Discharge: 2013-02-23 | Disposition: A | Discharge status: Home / Self Care | Payer: BC Managed Care – PPO | Source: Ambulatory Visit | Attending: Obstetrics | Admitting: Obstetrics

## 2013-02-23 ENCOUNTER — Encounter: Payer: BC Managed Care – PPO | Admitting: Obstetrics

## 2013-02-23 DIAGNOSIS — Z9889 Other specified postprocedural states: Secondary | ICD-10-CM

## 2013-02-23 DIAGNOSIS — O26899 Other specified pregnancy related conditions, unspecified trimester: Secondary | ICD-10-CM

## 2013-02-23 DIAGNOSIS — O99891 Other specified diseases and conditions complicating pregnancy: Secondary | ICD-10-CM | POA: Insufficient documentation

## 2013-02-23 DIAGNOSIS — E860 Dehydration: Secondary | ICD-10-CM | POA: Insufficient documentation

## 2013-02-23 DIAGNOSIS — R109 Unspecified abdominal pain: Secondary | ICD-10-CM | POA: Insufficient documentation

## 2013-02-23 DIAGNOSIS — Z98891 History of uterine scar from previous surgery: Secondary | ICD-10-CM

## 2013-02-23 DIAGNOSIS — O4700 False labor before 37 completed weeks of gestation, unspecified trimester: Secondary | ICD-10-CM | POA: Insufficient documentation

## 2013-02-23 DIAGNOSIS — B951 Streptococcus, group B, as the cause of diseases classified elsewhere: Secondary | ICD-10-CM

## 2013-02-23 LAB — URINALYSIS, ROUTINE W REFLEX MICROSCOPIC
Leukocytes, UA: NEGATIVE
Nitrite: NEGATIVE
Protein, ur: NEGATIVE mg/dL
Specific Gravity, Urine: >1.03 — ABNORMAL HIGH (ref 1.005–1.030)
Urobilinogen, UA: 0.2 mg/dL (ref 0.0–1.0)

## 2013-02-23 MED ORDER — LACTATED RINGERS IV BOLUS (SEPSIS)
1000.0000 mL | Freq: Once | INTRAVENOUS | Status: AC
  Administered 2013-02-23: 1000 mL via INTRAVENOUS

## 2013-02-23 NOTE — MAU Provider Note (Signed)
  History     CSN: [id number]  Arrival date and time: 02/23/13 1616   First Provider Initiated Contact with Patient 02/23/13 1701      No chief complaint on file.  HPI  Pt is G3P1011 [redacted]w[redacted]d pregnant and presents with ctx since noon today.  Pt's ctx are every 7-10 minutes.  Pt denies spotting or bleeding or LOF.  Pt has been nauseated for 2 days.  Pt's denies vomiting.   Pt denies UTI symptoms, constipation or diarrhea.   Pt denies complications with this pregnancy.  Pt has hx of 10 lb baby with C/S Pt has been tested for diabetes and neg this pregnancy and last.  Pt went to Dr. Verdell Carmine office and they sent her here.  Past Medical History  Diagnosis Date  . Abnormal Pap smear     repeat WNL  . Mental disorder     Depression    Past Surgical History  Procedure Laterality Date  . Cesarean section      Family History  Problem Relation Age of Onset  . Anesthesia problems Neg Hx   . Diabetes Father   . Diabetes Paternal Aunt     History  Substance Use Topics  . Smoking status: Never Smoker   . Smokeless tobacco: Never Used  . Alcohol Use: No    Allergies:  Allergies  Allergen Reactions  . Penicillins Hives  . Percocet [Oxycodone-Acetaminophen] Hives    Prescriptions prior to admission  Medication Sig Dispense Refill  . cyclobenzaprine (FLEXERIL) 10 MG tablet Take 1 tablet (10 mg total) by mouth 3 (three) times daily as needed for muscle spasms.  30 tablet  1  . Prenatal Vit-Fe Fumarate-FA (PRENATAL MULTIVITAMIN) TABS Take 1 tablet by mouth daily at 12 noon.        ROS Physical Exam   Blood pressure 132/62, pulse 112, temperature 98.6 F (37 C), temperature source Oral, resp. rate 20, height 5' 1.5" (1.562 m), weight 107.049 kg (236 lb), last menstrual period 06/26/2012.  Physical Exam  Vitals reviewed. Constitutional: She is oriented to person, place, and time. She appears well-developed and well-nourished. No distress.  HENT:  Head: Normocephalic.   Eyes: Pupils are equal, round, and reactive to light.  Neck: Normal range of motion. Neck supple.  Respiratory: Effort normal.  GI: Soft. She exhibits no distension. There is no tenderness. There is no rebound and no guarding.  FHR 145 with reactive pattern  Genitourinary:  Cervix 1 cm soft, high 20%effaced; occ ctx  Musculoskeletal: Normal range of motion.  Neurological: She is alert and oriented to person, place, and time.  Skin: Skin is warm and dry.  Psychiatric: She has a normal mood and affect.    MAU Course  Procedures IV LR 1 liter given with resolution of ctx Pt feels good and ready for d/c   Assessment and Plan  Abdominal pain in pregnancy Dehydration F/u with Dr. Clearance Coots for scheduled appointment- sooner if increase in pain or bleeding or LOF Kick count form given to pt  Carrie Massey 02/23/2013, 5:02 PM

## 2013-02-23 NOTE — MAU Note (Signed)
Sent from dr's office.  Is hurting, was not checked- just sent in

## 2013-03-02 ENCOUNTER — Ambulatory Visit (INDEPENDENT_AMBULATORY_CARE_PROVIDER_SITE_OTHER): Payer: BC Managed Care – PPO | Admitting: Obstetrics

## 2013-03-02 VITALS — BP 116/77 | Temp 98.0°F | Wt 237.8 lb

## 2013-03-02 DIAGNOSIS — Z3483 Encounter for supervision of other normal pregnancy, third trimester: Secondary | ICD-10-CM

## 2013-03-02 DIAGNOSIS — Z3480 Encounter for supervision of other normal pregnancy, unspecified trimester: Secondary | ICD-10-CM

## 2013-03-02 LAB — POCT URINALYSIS DIPSTICK
Ketones, UA: NEGATIVE
Leukocytes, UA: NEGATIVE
Nitrite, UA: NEGATIVE
Protein, UA: NEGATIVE
Urobilinogen, UA: NEGATIVE
pH, UA: 6

## 2013-03-02 NOTE — Progress Notes (Signed)
Pulse: 93

## 2013-03-09 ENCOUNTER — Encounter: Payer: Self-pay | Admitting: Obstetrics

## 2013-03-09 ENCOUNTER — Ambulatory Visit (INDEPENDENT_AMBULATORY_CARE_PROVIDER_SITE_OTHER): Payer: BC Managed Care – PPO | Admitting: Obstetrics

## 2013-03-09 VITALS — BP 116/78 | Temp 98.6°F | Wt 236.0 lb

## 2013-03-09 DIAGNOSIS — Z3483 Encounter for supervision of other normal pregnancy, third trimester: Secondary | ICD-10-CM

## 2013-03-09 DIAGNOSIS — O0990 Supervision of high risk pregnancy, unspecified, unspecified trimester: Secondary | ICD-10-CM | POA: Insufficient documentation

## 2013-03-09 DIAGNOSIS — Z3480 Encounter for supervision of other normal pregnancy, unspecified trimester: Secondary | ICD-10-CM

## 2013-03-09 LAB — POCT URINALYSIS DIPSTICK
Glucose, UA: NEGATIVE
Spec Grav, UA: 1.025
Urobilinogen, UA: NEGATIVE
pH, UA: 5

## 2013-03-09 NOTE — Progress Notes (Signed)
P 93  Patient states she has significant pelvic pressure.

## 2013-03-10 ENCOUNTER — Encounter: Payer: Self-pay | Admitting: Obstetrics

## 2013-03-12 ENCOUNTER — Inpatient Hospital Stay (HOSPITAL_COMMUNITY)
Admission: AD | Admit: 2013-03-12 | Discharge: 2013-03-12 | Disposition: A | Discharge status: Home / Self Care | Payer: BC Managed Care – PPO | Source: Ambulatory Visit | Attending: Obstetrics | Admitting: Obstetrics

## 2013-03-12 ENCOUNTER — Encounter (HOSPITAL_COMMUNITY): Payer: Self-pay | Admitting: *Deleted

## 2013-03-12 DIAGNOSIS — B951 Streptococcus, group B, as the cause of diseases classified elsewhere: Secondary | ICD-10-CM

## 2013-03-12 DIAGNOSIS — O368190 Decreased fetal movements, unspecified trimester, not applicable or unspecified: Secondary | ICD-10-CM | POA: Insufficient documentation

## 2013-03-12 DIAGNOSIS — Z98891 History of uterine scar from previous surgery: Secondary | ICD-10-CM

## 2013-03-12 DIAGNOSIS — O479 False labor, unspecified: Secondary | ICD-10-CM | POA: Insufficient documentation

## 2013-03-12 NOTE — MAU Note (Signed)
Pt presents with contractions  But pt unsure of what time they started and how often they are.  Pt also with s/o fluid "trickling" all day and "panties being constantly wet."  Pt denies pih symptoms and bleeding. Pt with s/o nausea and decreased fetal movement as well "as normal".

## 2013-03-16 ENCOUNTER — Ambulatory Visit (INDEPENDENT_AMBULATORY_CARE_PROVIDER_SITE_OTHER): Payer: BC Managed Care – PPO | Admitting: Obstetrics

## 2013-03-16 ENCOUNTER — Encounter: Payer: Self-pay | Admitting: Obstetrics

## 2013-03-16 VITALS — BP 122/77 | Temp 98.5°F | Wt 240.0 lb

## 2013-03-16 DIAGNOSIS — Z3483 Encounter for supervision of other normal pregnancy, third trimester: Secondary | ICD-10-CM

## 2013-03-16 DIAGNOSIS — Z3480 Encounter for supervision of other normal pregnancy, unspecified trimester: Secondary | ICD-10-CM

## 2013-03-16 NOTE — Progress Notes (Signed)
Pulse- 94 Pt states she is having vaginal pressure and pressure in her bottom.

## 2013-03-21 ENCOUNTER — Inpatient Hospital Stay (HOSPITAL_COMMUNITY)
Admission: AD | Admit: 2013-03-21 | Discharge: 2013-03-21 | Disposition: A | Discharge status: Home / Self Care | Payer: BC Managed Care – PPO | Source: Ambulatory Visit | Attending: Obstetrics | Admitting: Obstetrics

## 2013-03-21 ENCOUNTER — Other Ambulatory Visit: Payer: Self-pay | Admitting: Obstetrics

## 2013-03-21 ENCOUNTER — Encounter (HOSPITAL_COMMUNITY): Payer: Self-pay

## 2013-03-21 DIAGNOSIS — O479 False labor, unspecified: Secondary | ICD-10-CM | POA: Insufficient documentation

## 2013-03-21 MED ORDER — HYDROMORPHONE HCL 2 MG PO TABS
4.0000 mg | ORAL_TABLET | Freq: Once | ORAL | Status: AC
  Administered 2013-03-21: 4 mg via ORAL
  Filled 2013-03-21: qty 2

## 2013-03-21 NOTE — MAU Note (Signed)
Pt states she has been having uc's since yesterday, small amount of bleeding today, denies LOF.  States she lost her mucus plug a few days ago.

## 2013-03-23 ENCOUNTER — Inpatient Hospital Stay (HOSPITAL_COMMUNITY)
Admission: AD | Admit: 2013-03-23 | Discharge: 2013-03-23 | Disposition: A | Discharge status: Home / Self Care | Payer: BC Managed Care – PPO | Source: Ambulatory Visit | Attending: Obstetrics & Gynecology | Admitting: Obstetrics & Gynecology

## 2013-03-23 ENCOUNTER — Encounter: Payer: Self-pay | Admitting: Obstetrics

## 2013-03-23 ENCOUNTER — Encounter (HOSPITAL_COMMUNITY): Payer: Self-pay | Admitting: *Deleted

## 2013-03-23 ENCOUNTER — Ambulatory Visit (INDEPENDENT_AMBULATORY_CARE_PROVIDER_SITE_OTHER): Payer: BC Managed Care – PPO | Admitting: Obstetrics

## 2013-03-23 VITALS — BP 111/70 | Temp 97.9°F | Wt 243.0 lb

## 2013-03-23 DIAGNOSIS — Z3480 Encounter for supervision of other normal pregnancy, unspecified trimester: Secondary | ICD-10-CM

## 2013-03-23 DIAGNOSIS — O479 False labor, unspecified: Secondary | ICD-10-CM | POA: Insufficient documentation

## 2013-03-23 DIAGNOSIS — Z3483 Encounter for supervision of other normal pregnancy, third trimester: Secondary | ICD-10-CM

## 2013-03-23 LAB — POCT URINALYSIS DIPSTICK
Bilirubin, UA: NEGATIVE
Blood, UA: NEGATIVE
Glucose, UA: NEGATIVE
Ketones, UA: NEGATIVE
Spec Grav, UA: 1.025

## 2013-03-23 LAB — OB RESULTS CONSOLE GBS: GBS: POSITIVE

## 2013-03-23 MED ORDER — NALBUPHINE HCL 10 MG/ML IJ SOLN
10.0000 mg | Freq: Once | INTRAMUSCULAR | Status: AC
  Administered 2013-03-23: 10 mg via INTRAMUSCULAR
  Filled 2013-03-23: qty 1

## 2013-03-23 MED ORDER — NALBUPHINE HCL 10 MG/ML IJ SOLN
10.0000 mg | Freq: Once | INTRAMUSCULAR | Status: AC
  Administered 2013-03-23: 10 mg via INTRAVENOUS
  Filled 2013-03-23: qty 1

## 2013-03-23 NOTE — MAU Note (Signed)
Pt states she has been having contractions since Sunday and they have been constant since about 2230

## 2013-03-23 NOTE — Progress Notes (Signed)
Pulse- 87 Pt states she is having contractions about 6 minutes apart. Pt states she is having lower abdomen pain and pressure. Pt states is having trouble sleeping.

## 2013-03-24 ENCOUNTER — Inpatient Hospital Stay (HOSPITAL_COMMUNITY): Payer: BC Managed Care – PPO | Admitting: Anesthesiology

## 2013-03-24 ENCOUNTER — Inpatient Hospital Stay (HOSPITAL_COMMUNITY)
Admission: AD | Admit: 2013-03-24 | Discharge: 2013-03-26 | DRG: 775 | Disposition: A | Discharge status: Home / Self Care | Payer: BC Managed Care – PPO | Source: Ambulatory Visit | Attending: Obstetrics | Admitting: Obstetrics

## 2013-03-24 ENCOUNTER — Encounter (HOSPITAL_COMMUNITY): Payer: BC Managed Care – PPO | Admitting: Anesthesiology

## 2013-03-24 ENCOUNTER — Encounter (HOSPITAL_COMMUNITY): Payer: Self-pay | Admitting: *Deleted

## 2013-03-24 DIAGNOSIS — Z22330 Carrier of Group B streptococcus: Secondary | ICD-10-CM

## 2013-03-24 DIAGNOSIS — O99892 Other specified diseases and conditions complicating childbirth: Secondary | ICD-10-CM | POA: Diagnosis present

## 2013-03-24 DIAGNOSIS — O34219 Maternal care for unspecified type scar from previous cesarean delivery: Secondary | ICD-10-CM | POA: Diagnosis not present

## 2013-03-24 DIAGNOSIS — Z98891 History of uterine scar from previous surgery: Secondary | ICD-10-CM

## 2013-03-24 DIAGNOSIS — B951 Streptococcus, group B, as the cause of diseases classified elsewhere: Secondary | ICD-10-CM

## 2013-03-24 HISTORY — DX: Major depressive disorder, single episode, unspecified: F32.9

## 2013-03-24 HISTORY — DX: Depression, unspecified: F32.A

## 2013-03-24 HISTORY — DX: Essential (primary) hypertension: I10

## 2013-03-24 HISTORY — DX: Thyrotoxicosis, unspecified without thyrotoxic crisis or storm: E05.90

## 2013-03-24 LAB — CBC
MCH: 29.2 pg (ref 26.0–34.0)
MCHC: 35.2 g/dL (ref 30.0–36.0)
Platelets: 201 10*3/uL (ref 150–400)

## 2013-03-24 LAB — RPR: RPR Ser Ql: NONREACTIVE

## 2013-03-24 MED ORDER — FENTANYL 2.5 MCG/ML BUPIVACAINE 1/10 % EPIDURAL INFUSION (WH - ANES)
14.0000 mL/h | INTRAMUSCULAR | Status: DC | PRN
  Administered 2013-03-24 (×2): 14 mL/h via EPIDURAL
  Filled 2013-03-24 (×2): qty 125

## 2013-03-24 MED ORDER — LIDOCAINE HCL (PF) 1 % IJ SOLN
30.0000 mL | INTRAMUSCULAR | Status: DC | PRN
  Administered 2013-03-24: 30 mL via SUBCUTANEOUS
  Filled 2013-03-24 (×2): qty 30

## 2013-03-24 MED ORDER — CLINDAMYCIN PHOSPHATE 900 MG/50ML IV SOLN
900.0000 mg | Freq: Three times a day (TID) | INTRAVENOUS | Status: DC
  Administered 2013-03-24 (×2): 900 mg via INTRAVENOUS
  Filled 2013-03-24 (×3): qty 50

## 2013-03-24 MED ORDER — ONDANSETRON HCL 4 MG PO TABS: 4.0000 mg | ORAL_TABLET | ORAL | Status: DC | PRN

## 2013-03-24 MED ORDER — EPHEDRINE 5 MG/ML INJ
10.0000 mg | INTRAVENOUS | Status: DC | PRN
  Filled 2013-03-24: qty 2

## 2013-03-24 MED ORDER — DIBUCAINE 1 % RE OINT: 1.0000 "application " | TOPICAL_OINTMENT | RECTAL | Status: DC | PRN

## 2013-03-24 MED ORDER — CITRIC ACID-SODIUM CITRATE 334-500 MG/5ML PO SOLN: 30.0000 mL | ORAL | Status: DC | PRN

## 2013-03-24 MED ORDER — BENZOCAINE-MENTHOL 20-0.5 % EX AERO: 1.0000 "application " | INHALATION_SPRAY | CUTANEOUS | Status: DC | PRN

## 2013-03-24 MED ORDER — SODIUM BICARBONATE 8.4 % IV SOLN
INTRAVENOUS | Status: DC | PRN
  Administered 2013-03-24: 4 mL via EPIDURAL
  Administered 2013-03-24: 5 mL via EPIDURAL

## 2013-03-24 MED ORDER — MEASLES, MUMPS & RUBELLA VAC ~~LOC~~ INJ: 0.5000 mL | INJECTION | Freq: Once | SUBCUTANEOUS | Status: DC

## 2013-03-24 MED ORDER — PHENYLEPHRINE 40 MCG/ML (10ML) SYRINGE FOR IV PUSH (FOR BLOOD PRESSURE SUPPORT)
80.0000 ug | PREFILLED_SYRINGE | INTRAVENOUS | Status: DC | PRN
  Filled 2013-03-24: qty 10
  Filled 2013-03-24: qty 2
  Filled 2013-03-24: qty 10

## 2013-03-24 MED ORDER — EPHEDRINE 5 MG/ML INJ
10.0000 mg | INTRAVENOUS | Status: DC | PRN
  Filled 2013-03-24 (×2): qty 4
  Filled 2013-03-24: qty 2

## 2013-03-24 MED ORDER — LACTATED RINGERS IV SOLN: 500.0000 mL | Freq: Once | INTRAVENOUS | Status: DC

## 2013-03-24 MED ORDER — OXYTOCIN BOLUS FROM INFUSION: 500.0000 mL | INTRAVENOUS | Status: DC

## 2013-03-24 MED ORDER — BUPIVACAINE HCL (PF) 0.25 % IJ SOLN
INTRAMUSCULAR | Status: DC | PRN
  Administered 2013-03-24: 3 mL
  Administered 2013-03-24: 4 mL
  Administered 2013-03-24: 2 mL

## 2013-03-24 MED ORDER — ONDANSETRON HCL 4 MG/2ML IJ SOLN: 4.0000 mg | INTRAMUSCULAR | Status: DC | PRN

## 2013-03-24 MED ORDER — ZOLPIDEM TARTRATE 5 MG PO TABS: 5.0000 mg | ORAL_TABLET | Freq: Every evening | ORAL | Status: DC | PRN

## 2013-03-24 MED ORDER — TERBUTALINE SULFATE 1 MG/ML IJ SOLN: 0.2500 mg | Freq: Once | INTRAMUSCULAR | Status: DC | PRN

## 2013-03-24 MED ORDER — LACTATED RINGERS IV SOLN
INTRAVENOUS | Status: DC
  Administered 2013-03-24 (×2): via INTRAVENOUS

## 2013-03-24 MED ORDER — OXYCODONE-ACETAMINOPHEN 5-325 MG PO TABS: 1.0000 | ORAL_TABLET | ORAL | Status: DC | PRN

## 2013-03-24 MED ORDER — OXYTOCIN 40 UNITS IN LACTATED RINGERS INFUSION - SIMPLE MED: 62.5000 mL/h | INTRAVENOUS | Status: DC

## 2013-03-24 MED ORDER — LANOLIN HYDROUS EX OINT: TOPICAL_OINTMENT | CUTANEOUS | Status: DC | PRN

## 2013-03-24 MED ORDER — IBUPROFEN 600 MG PO TABS
600.0000 mg | ORAL_TABLET | Freq: Four times a day (QID) | ORAL | Status: DC
  Administered 2013-03-24 – 2013-03-26 (×7): 600 mg via ORAL
  Filled 2013-03-24 (×7): qty 1

## 2013-03-24 MED ORDER — DIPHENHYDRAMINE HCL 25 MG PO CAPS: 25.0000 mg | ORAL_CAPSULE | Freq: Four times a day (QID) | ORAL | Status: DC | PRN

## 2013-03-24 MED ORDER — FLEET ENEMA 7-19 GM/118ML RE ENEM: 1.0000 | ENEMA | RECTAL | Status: DC | PRN

## 2013-03-24 MED ORDER — OXYTOCIN 40 UNITS IN LACTATED RINGERS INFUSION - SIMPLE MED
1.0000 m[IU]/min | INTRAVENOUS | Status: DC
  Administered 2013-03-24: 2 m[IU]/min via INTRAVENOUS
  Administered 2013-03-24: 1 m[IU]/min via INTRAVENOUS
  Filled 2013-03-24: qty 1000

## 2013-03-24 MED ORDER — PRENATAL MULTIVITAMIN CH
1.0000 | ORAL_TABLET | Freq: Every day | ORAL | Status: DC
  Administered 2013-03-25 – 2013-03-26 (×2): 1 via ORAL
  Filled 2013-03-24 (×2): qty 1

## 2013-03-24 MED ORDER — IBUPROFEN 600 MG PO TABS: 600.0000 mg | ORAL_TABLET | Freq: Four times a day (QID) | ORAL | Status: DC | PRN

## 2013-03-24 MED ORDER — WITCH HAZEL-GLYCERIN EX PADS: 1.0000 "application " | MEDICATED_PAD | CUTANEOUS | Status: DC | PRN

## 2013-03-24 MED ORDER — SENNOSIDES-DOCUSATE SODIUM 8.6-50 MG PO TABS
2.0000 | ORAL_TABLET | ORAL | Status: DC
  Administered 2013-03-24 – 2013-03-25 (×2): 2 via ORAL
  Filled 2013-03-24 (×2): qty 2

## 2013-03-24 MED ORDER — ACETAMINOPHEN 325 MG PO TABS: 650.0000 mg | ORAL_TABLET | ORAL | Status: DC | PRN

## 2013-03-24 MED ORDER — FERROUS SULFATE 325 (65 FE) MG PO TABS
325.0000 mg | ORAL_TABLET | Freq: Two times a day (BID) | ORAL | Status: DC
  Administered 2013-03-25 – 2013-03-26 (×3): 325 mg via ORAL
  Filled 2013-03-24 (×3): qty 1

## 2013-03-24 MED ORDER — PHENYLEPHRINE 40 MCG/ML (10ML) SYRINGE FOR IV PUSH (FOR BLOOD PRESSURE SUPPORT)
80.0000 ug | PREFILLED_SYRINGE | INTRAVENOUS | Status: DC | PRN
  Filled 2013-03-24: qty 2

## 2013-03-24 MED ORDER — TRAMADOL HCL 50 MG PO TABS
100.0000 mg | ORAL_TABLET | Freq: Four times a day (QID) | ORAL | Status: DC | PRN
  Administered 2013-03-25: 100 mg via ORAL
  Filled 2013-03-24: qty 2

## 2013-03-24 MED ORDER — DIPHENHYDRAMINE HCL 50 MG/ML IJ SOLN: 12.5000 mg | INTRAMUSCULAR | Status: DC | PRN

## 2013-03-24 MED ORDER — ONDANSETRON HCL 4 MG/2ML IJ SOLN
4.0000 mg | Freq: Four times a day (QID) | INTRAMUSCULAR | Status: DC | PRN
  Administered 2013-03-24: 4 mg via INTRAVENOUS
  Filled 2013-03-24: qty 2

## 2013-03-24 MED ORDER — TETANUS-DIPHTH-ACELL PERTUSSIS 5-2.5-18.5 LF-MCG/0.5 IM SUSP
0.5000 mL | Freq: Once | INTRAMUSCULAR | Status: AC
  Administered 2013-03-25: 0.5 mL via INTRAMUSCULAR
  Filled 2013-03-24: qty 0.5

## 2013-03-24 MED ORDER — MAGNESIUM HYDROXIDE 400 MG/5ML PO SUSP: 30.0000 mL | ORAL | Status: DC | PRN

## 2013-03-24 MED ORDER — LACTATED RINGERS IV SOLN
500.0000 mL | INTRAVENOUS | Status: DC | PRN
  Administered 2013-03-24: 500 mL via INTRAVENOUS

## 2013-03-24 NOTE — Progress Notes (Addendum)
Carrie Massey is a 27 y.o. G3P1011 at [redacted]w[redacted]d by LMP admitted for active labor  Subjective: Patient reports labor symptoms throughout the week. Labor became more active at time of ROM, comfortable now w/ CLE.  Objective: BP 134/86  Pulse 109  Temp(Src) 98.4 F (36.9 C) (Oral)  Resp 18  Ht 5\' 2"  (1.575 m)  Wt 234 lb (106.142 kg)  BMI 42.79 kg/m2  SpO2 100%  LMP 06/26/2012   Total I/O In: -  Out: 950 [Urine:950]  FHT:  FHR: 145 bpm, variability: moderate,  accelerations:  Present,  decelerations:  Absent UC:   irregular, every 2-6 minutes SVE:   Dilation: 8.5 Effacement (%): 90 Station: 0;+1 Exam by:: Jamarquis Crull CNM  Labs: Lab Results  Component Value Date   WBC 13.9* 03/24/2013   HGB 13.6 03/24/2013   HCT 38.6 03/24/2013   MCV 82.8 03/24/2013   PLT 201 03/24/2013    Assessment / Plan: Protracted latent phase IUPC placed at this time Start Pitocin augmentation MD Tamela Oddi aware and agrees w/ plan of care  Labor: Starting pitocin to help augment labor at this time Preeclampsia:  labs stable Fetal Wellbeing:  Category I Pain Control:  Epidural I/D:  n/a Anticipated MOD:  NSVD  Carrie Massey 03/24/2013, 4:27 PM

## 2013-03-24 NOTE — MAU Note (Signed)
PT  SAYS SHE WAS HERE Thursday  AM - VE  1 CM.  WENT TO OFFICE ON Thursday.  HAS RETURNED  WITH STRONG UC.    NOT SROM,  NO BLEEDING.   POSTIVE GBS.   DENIES HSV AND MRSA

## 2013-03-24 NOTE — H&P (Signed)
Carrie Massey is a 27 y.o. female presenting for labor. Maternal Medical History:  Reason for admission: Contractions.  Nausea.  Contractions: Onset was 2 days ago.   Frequency: regular.   Duration is approximately 60 seconds.   Perceived severity is moderate.    Fetal activity: Perceived fetal activity is normal.   Last perceived fetal movement was within the past hour.    Prenatal complications: No bleeding or hypertension.   History of previous c-section     OB History   Grav Para Term Preterm Abortions TAB SAB Ect Mult Living   3 1 1  1  1   1      Past Medical History  Diagnosis Date  . Mental disorder     Depression  . Abnormal Pap smear     repeat WNL  . Depression   . Hypertension     Not during pregnancy - not taking meds  . Hyperthyroidism     Prior to pregnancy - no meds   Past Surgical History  Procedure Laterality Date  . Cesarean section     Family History: family history includes Diabetes in her father and paternal aunt. There is no history of Anesthesia problems. Social History:  reports that she has never smoked. She has never used smokeless tobacco. She reports that she does not drink alcohol or use illicit drugs.   Prenatal Transfer Tool  Maternal Diabetes: No Genetic Screening: Normal Maternal Ultrasounds/Referrals: Normal Fetal Ultrasounds or other Referrals:  None Maternal Substance Abuse:  No Significant Maternal Medications:  None Significant Maternal Lab Results:  None Other Comments:  None  Review of Systems  Constitutional: Negative.   HENT: Negative.   Eyes: Negative.   Respiratory: Negative.   Cardiovascular: Negative.   Gastrointestinal: Positive for abdominal pain. Negative for heartburn, nausea, vomiting, diarrhea, constipation, blood in stool and melena.  Genitourinary: Negative.   Musculoskeletal: Negative.   Skin: Negative.   Neurological: Negative.   Endo/Heme/Allergies: Negative.   Psychiatric/Behavioral:  Negative.     Dilation: 8.5 Effacement (%): 90 Station: 0;+1 Exam by:: Conlee Sliter CNM Blood pressure 131/80, pulse 103, temperature 98.4 F (36.9 C), temperature source Oral, resp. rate 18, height 5\' 2"  (1.575 m), weight 234 lb (106.142 kg), last menstrual period 06/26/2012, SpO2 100.00%. Maternal Exam:  Uterine Assessment: Contraction strength is moderate.  Contraction frequency is regular.   Abdomen: Patient reports no abdominal tenderness. Surgical scars: low transverse.   Estimated fetal weight is 3300.   Fetal presentation: vertex  Introitus: Normal vulva. Normal vagina.  Ferning test: not done.  Nitrazine test: not done. Amniotic fluid character: not assessed.  Pelvis: adequate for delivery.   Cervix: Cervix evaluated by digital exam.     Physical Exam  Constitutional: She is oriented to person, place, and time. She appears well-developed and well-nourished.  HENT:  Head: Normocephalic.  Eyes: Pupils are equal, round, and reactive to light.  Neck: Normal range of motion. Neck supple.  Cardiovascular: Normal rate, regular rhythm, normal heart sounds and intact distal pulses.   Respiratory: Effort normal and breath sounds normal.  GI: Soft. Bowel sounds are normal.  Genitourinary: Vagina normal and uterus normal.  Musculoskeletal: Normal range of motion.  Neurological: She is alert and oriented to person, place, and time.  Skin: Skin is warm and dry.  Psychiatric: She has a normal mood and affect. Her behavior is normal. Judgment and thought content normal.    Prenatal labs: ABO, Rh: A/POS/-- (04/29 1347) Antibody: NEG (04/29 1347)  Rubella: 1.25 (04/29 1347) RPR: NON REACTIVE (11/07 0710)  HBsAg: NEGATIVE (04/29 1347)  HIV: NON REACTIVE (07/24 1730)  GBS: Positive (11/06 0000)   Assessment/Plan: Admit to L&D for delivery Patient desires VBAC, aware of risk and benefits Continuous EFM IV access Anticipate VBAC MD Tamela Oddi aware of patient status and agrees  with plan of care. IV antibiotics for GBS   Venezia Sargeant 03/24/2013, 4:21 PM

## 2013-03-24 NOTE — Anesthesia Procedure Notes (Signed)
Epidural Patient location during procedure: OB  Preanesthetic Checklist Completed: patient identified, site marked, surgical consent, pre-op evaluation, timeout performed, IV checked, risks and benefits discussed and monitors and equipment checked  Epidural Patient position: sitting Prep: site prepped and draped and DuraPrep Patient monitoring: continuous pulse ox and blood pressure Approach: midline Injection technique: LOR air  Needle:  Needle type: Tuohy  Needle gauge: 17 G Needle length: 9 cm and 9 Needle insertion depth: 9 cm Catheter type: closed end flexible Catheter size: 19 Gauge Catheter at skin depth: 18 cm Test dose: negative  Assessment Events: blood not aspirated, injection not painful, no injection resistance, negative IV test and no paresthesia  Additional Notes Dosing of Epidural:  1st dose, through catheter ............................................. epi 1:200K + Xylocaine 40 mg  2nd dose, through catheter, after waiting 3 minutes.....epi 1:200K + Xylocaine 60 mg    ( 2% Xylo charted as a single dose in Epic Meds for ease of charting; actual dosing was fractionated as above, for saftey's sake)  As each dose occurred, patient was free of IV sx; and patient exhibited no evidence of SA injection.  Patient is more comfortable after epidural dosed. Please see RN's note for documentation of vital signs,and FHR which are stable.  Patient reminded not to try to ambulate with numb legs, and that an RN must be present when she attempts to get up.       

## 2013-03-24 NOTE — Progress Notes (Signed)
[id number] called and spoke with RN in operating room where dr Tamela Oddi was performing a c/s.  Md notified pt is miserable with pain and wanting to push with each uc.  Notified of fetal tachycardia as well.  Dr Tamela Oddi stated pt is not to push and anesthesia is to be called for redose.

## 2013-03-24 NOTE — Anesthesia Preprocedure Evaluation (Signed)
Anesthesia Evaluation  Patient identified by MRN, date of birth, ID band Patient awake    Reviewed: Allergy & Precautions, H&P , Patient's Chart, lab work & pertinent test results  Airway Mallampati: II TM Distance: >3 FB Neck ROM: full    Dental no notable dental hx.    Pulmonary  breath sounds clear to auscultation  Pulmonary exam normal       Cardiovascular Exercise Tolerance: Good hypertension (no meds), Rhythm:regular Rate:Normal     Neuro/Psych    GI/Hepatic   Endo/Other  Morbid obesity  Renal/GU      Musculoskeletal   Abdominal   Peds  Hematology   Anesthesia Other Findings   Reproductive/Obstetrics                           Anesthesia Physical Anesthesia Plan  ASA: III  Anesthesia Plan: Epidural   Post-op Pain Management:    Induction:   Airway Management Planned:   Additional Equipment:   Intra-op Plan:   Post-operative Plan:   Informed Consent: I have reviewed the patients History and Physical, chart, labs and discussed the procedure including the risks, benefits and alternatives for the proposed anesthesia with the patient or authorized representative who has indicated his/her understanding and acceptance.   Dental Advisory Given  Plan Discussed with:   Anesthesia Plan Comments: (Labs checked- platelets confirmed with RN in room. Fetal heart tracing, per RN, reported to be stable enough for sitting procedure. Discussed epidural, and patient consents to the procedure:  included risk of possible headache,backache, failed block, allergic reaction, and nerve injury. This patient was asked if she had any questions or concerns before the procedure started. )        Anesthesia Quick Evaluation

## 2013-03-25 LAB — TYPE AND SCREEN: ABO/RH(D): A POS

## 2013-03-25 MED ORDER — INFLUENZA VAC SPLIT QUAD 0.5 ML IM SUSP
0.5000 mL | INTRAMUSCULAR | Status: AC
  Administered 2013-03-26: 0.5 mL via INTRAMUSCULAR
  Filled 2013-03-25: qty 0.5

## 2013-03-25 NOTE — Lactation Note (Signed)
This note was copied from the chart of Carrie Karolee Ohs. Lactation Consultation Note; Initial visit with mom. She is requesting pump because of inverted nipples. Reports that she plans to latch baby some while here in hospital but once home will mostly pump and bottle feed EBM. DEBP set up for mom with instructions for use and cleaning of pieces. Mom pumping at present. Nipples look erect but mom states when baby is trying to latch on they retract.Baby asleep in bassinet. No further questions at present. Plans to get pump from insurance company for home use. BF brochure given with resources for support after DC. To call for assist prn.  Patient Name: Carrie Massey [id number] Date: 03/25/2013 Reason for consult: Initial assessment   Maternal Data Formula Feeding for Exclusion: No Infant to breast within first hour of birth: Yes Does the patient have breastfeeding experience prior to this delivery?: Yes  Feeding    LATCH Score/Interventions                      Lactation Tools Discussed/Used     Consult Status Consult Status: Follow-up Date: 03/26/13 Follow-up type: In-patient    Pamelia Hoit 03/25/2013, 2:26 PM

## 2013-03-25 NOTE — Plan of Care (Signed)
Problem: Consults Goal: Birthing Suites Patient Information Press F2 to bring up selections list Outcome: Completed/Met Date Met:  03/25/13  Pt 37-[redacted] weeks EGA

## 2013-03-25 NOTE — Progress Notes (Signed)
Patient ID: Carrie Massey, female   DOB: 1986-05-03, 27 y.o.   MRN: [id number] Post Partum Day 1 S/P spontaneous vaginal RH status/Rubella reviewed.  Feeding: unknown Subjective: No HA, SOB, CP, F/C, breast symptoms. Normal vaginal bleeding, no clots.     Objective: BP 103/60  Pulse 90  Temp(Src) 98.2 F (36.8 C) (Oral)  Resp 20  Ht 5\' 2"  (1.575 m)  Wt 106.142 kg (234 lb)  BMI 42.79 kg/m2  SpO2 100%  LMP 06/26/2012   Physical Exam:  General: alert Lochia: appropriate Uterine Fundus: firm DVT Evaluation: No evidence of DVT seen on physical exam. Ext: No c/c/e  Recent Labs  03/24/13 0710  HGB 13.6  HCT 38.6      Assessment/Plan: 27 y.o.  PPD #1 .  normal postpartum exam Continue current postpartum care Ambulate   LOS: 1 day   JACKSON-MOORE,Tempest Frankland A 03/25/2013, 10:05 AM

## 2013-03-25 NOTE — Anesthesia Postprocedure Evaluation (Signed)
  Anesthesia Post-op Note  Patient: Carrie Massey  Procedure(s) Performed: * No procedures listed *  Patient Location: PACU and Mother/Baby  Anesthesia Type:Epidural  Level of Consciousness: awake, alert  and oriented  Airway and Oxygen Therapy: Patient Spontanous Breathing  Post-op Pain: none  Post-op Assessment: Post-op Vital signs reviewed, Patient's Cardiovascular Status Stable, No headache, No backache, No residual numbness and No residual motor weakness  Post-op Vital Signs: Reviewed and stable  Complications: No apparent anesthesia complications

## 2013-03-26 MED ORDER — TRAMADOL HCL 50 MG PO TABS: 100.0000 mg | ORAL_TABLET | Freq: Four times a day (QID) | ORAL | Status: DC | PRN

## 2013-03-26 NOTE — Discharge Summary (Signed)
  Obstetric Discharge Summary Reason for Admission: onset of labor Prenatal Procedures: none Intrapartum Procedures: spontaneous vaginal delivery Postpartum Procedures: none Complications-Operative and Postpartum: none  Hemoglobin  Date Value Range Status  03/24/2013 13.6  12.0 - 15.0 g/dL Final     HCT  Date Value Range Status  03/24/2013 38.6  36.0 - 46.0 % Final    Physical Exam:  General: alert Lochia: appropriate Uterine: firm Incision: n/a DVT Evaluation: No evidence of DVT seen on physical exam.  Discharge Diagnoses: Active Problems:   VBAC, delivered   Discharge Information: Date: 03/26/2013 Activity: pelvic rest Diet: routine Medications:  Prior to Admission medications   Medication Sig Start Date End Date Taking? Authorizing Provider  Prenatal Vit-Fe Fumarate-FA (PRENATAL MULTIVITAMIN) TABS Take 1 tablet by mouth daily at 12 noon.   Yes Historical Provider, MD  traMADol (ULTRAM) 50 MG tablet Take 2 tablets (100 mg total) by mouth every 6 (six) hours as needed for moderate pain. 03/26/13   Antionette Char, MD    Condition: stable Instructions: refer to routine discharge instructions Discharge to: home Follow-up Information   Follow up with HARPER,CHARLES A, MD. Schedule an appointment as soon as possible for a visit in 2 weeks.   Specialty:  Obstetrics and Gynecology   Contact information:   [address] [place] [address] [phone]       Newborn Data:  Live born female  Birth Weight: 6 lb 13.3 oz (3099 g) APGAR: 9, 9   Home with mother.  JACKSON-MOORE,Claus Silvestro A 03/26/2013, 10:20 AM

## 2013-03-26 NOTE — Lactation Note (Signed)
This note was copied from the chart of Carrie Karolee Ohs. Lactation Consultation Note: Checked on mom to see if she is ready for pump rental. Mom attempting to latch baby when I went in, Baby sucking on lips. Mom decided to bottle feed EBM she had pumped earlier this morning. To call when she is ready for pump rental.  Patient Name: Carrie Massey [id number] Date: 03/26/2013 Reason for consult: Follow-up assessment   Maternal Data    Feeding Feeding Type: Breast Fed  LATCH Score/Interventions Latch: Repeated attempts needed to sustain latch, nipple held in mouth throughout feeding, stimulation needed to elicit sucking reflex.  Audible Swallowing: None  Type of Nipple: Everted at rest and after stimulation  Comfort (Breast/Nipple): Soft / non-tender     Hold (Positioning): Assistance needed to correctly position infant at breast and maintain latch.  LATCH Score: 6  Lactation Tools Discussed/Used     Consult Status Consult Status: Follow-up    Pamelia Hoit 03/26/2013, 11:51 AM

## 2013-03-27 ENCOUNTER — Encounter: Payer: Self-pay | Admitting: Obstetrics

## 2013-03-30 ENCOUNTER — Encounter: Payer: BC Managed Care – PPO | Admitting: Obstetrics

## 2013-04-05 ENCOUNTER — Encounter: Payer: Self-pay | Admitting: Obstetrics

## 2013-04-06 ENCOUNTER — Encounter: Payer: Self-pay | Admitting: Obstetrics

## 2013-04-06 ENCOUNTER — Ambulatory Visit (INDEPENDENT_AMBULATORY_CARE_PROVIDER_SITE_OTHER): Payer: BC Managed Care – PPO | Admitting: Obstetrics

## 2013-04-06 ENCOUNTER — Encounter: Payer: BC Managed Care – PPO | Admitting: Obstetrics

## 2013-04-06 VITALS — BP 110/75 | HR 87 | Temp 98.8°F | Ht 62.0 in | Wt 224.0 lb

## 2013-04-06 DIAGNOSIS — Z09 Encounter for follow-up examination after completed treatment for conditions other than malignant neoplasm: Secondary | ICD-10-CM

## 2013-04-06 LAB — POCT URINALYSIS DIPSTICK
Glucose, UA: NEGATIVE
Ketones, UA: NEGATIVE
Nitrite, UA: NEGATIVE
Spec Grav, UA: 1.015
Urobilinogen, UA: NEGATIVE
pH, UA: 6

## 2013-04-06 NOTE — Progress Notes (Signed)
Subjective:     Carrie Massey is a 27 y.o. female who presents for a postpartum visit. She is 2 weeks postpartum following a spontaneous vaginal delivery. I have fully reviewed the prenatal and intrapartum course. The delivery was at 39 gestational weeks. Outcome: spontaneous vaginal delivery. Anesthesia: epidural. Postpartum course has been normal. Baby's course has been normal. Baby is feeding by bottle - gerber soothe. Bleeding moderate red. Bowel function is normal. Bladder function is normal. Patient is not sexually active. Contraception method is abstinence. Postpartum depression screening: negative.  The following portions of the patient's history were reviewed and updated as appropriate: allergies, current medications, past family history, past medical history, past social history, past surgical history and problem list.  Review of Systems Pertinent items are noted in HPI.   Objective:    BP 110/75  Pulse 87  Temp(Src) 98.8 F (37.1 C)  Ht 5\' 2"  (1.575 m)  Wt 224 lb (101.606 kg)  BMI 40.96 kg/m2  General:  alert and no distress   Breasts:  inspection negative, no nipple discharge or bleeding, no masses or nodularity palpable Abdomen:  Soft, NT. Pelvic:  NEFG.  NT.  Uterus NSSC, NT.  Adnexa negative.   Assessment:     Normal postpartum exam. Pap smear not done at today's visit.   Plan:    1. Contraception: abstinence 2. Contraceptive options Rx. 3. Follow up in: 2 weeks or as needed.

## 2013-05-04 ENCOUNTER — Ambulatory Visit (INDEPENDENT_AMBULATORY_CARE_PROVIDER_SITE_OTHER): Payer: BC Managed Care – PPO | Admitting: Obstetrics

## 2013-05-04 ENCOUNTER — Encounter: Payer: Self-pay | Admitting: Obstetrics

## 2013-05-04 VITALS — BP 126/90 | HR 75 | Temp 98.3°F | Wt 229.0 lb

## 2013-05-04 DIAGNOSIS — Z30017 Encounter for initial prescription of implantable subdermal contraceptive: Secondary | ICD-10-CM

## 2013-05-04 DIAGNOSIS — Z3202 Encounter for pregnancy test, result negative: Secondary | ICD-10-CM

## 2013-05-04 DIAGNOSIS — Z01812 Encounter for preprocedural laboratory examination: Secondary | ICD-10-CM

## 2013-05-04 LAB — POCT URINE PREGNANCY: Preg Test, Ur: NEGATIVE

## 2013-05-04 NOTE — Progress Notes (Addendum)
Subjective:     Carrie Massey is a 27 y.o. female who presents for a postpartum visit. She is 6 weeks postpartum following a spontaneous vaginal delivery. I have fully reviewed the prenatal and intrapartum course. The delivery was at 39 gestational weeks. Outcome: spontaneous vaginal delivery. Anesthesia: epidural. Postpartum course has been best as it can.  Pt states that the past week has been a change in the past.  Pt is feeling overwhelmed as time goes by. Pt doesn't have any help from FOB. Pt does have family support.  Baby's course has been going well. Pt states that baby isn't a good sleeper. Baby is feeding by bottle - gerber sooth. Bleeding thin lochia. Bowel function is normal. Bladder function is normal. Patient is sexually active. Pt states last intercourse was 1.5 weeks ago and protection was used. Contraception method is none. Postpartum depression screening: positive. Nexplanon insertion planned at this visit. UPT ordered.  The following portions of the patient's history were reviewed and updated as appropriate: allergies, current medications, past family history, past medical history, past social history, past surgical history and problem list.  Review of Systems Pertinent items are noted in HPI.   Objective:    BP 126/90  Pulse 75  Temp(Src) 98.3 F (36.8 C)  Wt 229 lb (103.874 kg)  LMP 05/01/2013  Breastfeeding? No  General:  alert and no distress   NEXPLANON INSERTION NOTE  Date of LMP:   6 weeks postpartum  Contraception used: Abstinence  Pregnancy test result:  Lab Results  Component Value Date   PREGTESTUR POSITIVE* 07/27/2012   PREGSERUM NEGATIVE 11/27/2011   HCG 11894.0 10/11/2012    Indications:  The patient desires contraception.  She understands risks, benefits, and alternatives to Implanon and would like to proceed.  Anesthesia:   Lidocaine 1% plain.  Procedure:  A time-out was performed confirming the procedure and the patient's allergy status.   The patient's non-dominant was identified as the left arm.  The protection cap was removed. While placing countertraction on the skin, the needle was inserted at a 30 degree angle.  The applicator was held horizontal to the skin; the skin was tented upward as the needle was introduced into the subdermal space.  While holding the applicator in place, the slider was unlocked. The Nexplanon was removed from the field.  The Nexplanon was palpated to ensure proper placement.  Complications: None  Instructions:  The patient was instructed to remove the dressing in 24 hours and that some bruising is to be expected.  She was advised to use over the counter analgesics as needed for any pain at the site.  She is to keep the area dry for 24 hours and to call if her hand or arm becomes cold, numb, or blue.  EXP:  04 / 2017 LOT #:  [id number] / [id number]  Return visit:  Return in 2 weeks

## 2013-05-15 ENCOUNTER — Ambulatory Visit (INDEPENDENT_AMBULATORY_CARE_PROVIDER_SITE_OTHER): Payer: BC Managed Care – PPO | Admitting: Obstetrics & Gynecology

## 2013-05-15 ENCOUNTER — Encounter: Payer: Self-pay | Admitting: Obstetrics & Gynecology

## 2013-05-15 ENCOUNTER — Ambulatory Visit (HOSPITAL_COMMUNITY)
Admission: RE | Admit: 2013-05-15 | Discharge: 2013-05-15 | Disposition: A | Discharge status: Home / Self Care | Payer: BC Managed Care – PPO | Attending: Psychiatry | Admitting: Psychiatry

## 2013-05-15 VITALS — BP 122/79 | HR 89 | Temp 98.9°F | Ht 62.0 in | Wt 221.0 lb

## 2013-05-15 DIAGNOSIS — F53 Postpartum depression: Secondary | ICD-10-CM

## 2013-05-15 DIAGNOSIS — Z7251 High risk heterosexual behavior: Secondary | ICD-10-CM

## 2013-05-15 DIAGNOSIS — F329 Major depressive disorder, single episode, unspecified: Secondary | ICD-10-CM

## 2013-05-15 DIAGNOSIS — O99345 Other mental disorders complicating the puerperium: Secondary | ICD-10-CM

## 2013-05-15 DIAGNOSIS — F3289 Other specified depressive episodes: Secondary | ICD-10-CM

## 2013-05-15 LAB — POCT URINALYSIS DIPSTICK
Bilirubin, UA: NEGATIVE
Glucose, UA: NEGATIVE
Ketones, UA: NEGATIVE
Leukocytes, UA: NEGATIVE
Nitrite, UA: NEGATIVE
Spec Grav, UA: 1.025
pH, UA: 5

## 2013-05-15 NOTE — Progress Notes (Signed)
Subjective:     Carrie Massey is a 27 y.o. female who presents for a postpartum visit. She is 7 weeks postpartum following a spontaneous vaginal delivery. I have fully reviewed the prenatal and intrapartum course. The delivery was at 39 gestational weeks. Outcome: spontaneous vaginal delivery. Anesthesia: epidural. Postpartum course has been abnormal, reports severe depression. Baby's course has been normal. Baby is feeding by bottle - gerber soothe. Bleeding no bleeding. Bowel function is normal. Bladder function is normal. Patient is not sexually active. Contraception method is abstinence. Postpartum depression screening: positive, postpartum depression screening score 14 (severe depression) The following portions of the patient's history were reviewed and updated as appropriate: allergies, current medications, past family history, past medical history, past social history, past surgical history and problem list.  Review of Systems Pertinent items are noted in HPI.   Objective:    BP 122/79  Pulse 89  Temp(Src) 98.9 F (37.2 C)  Ht 5\' 2"  (1.575 m)  Wt 221 lb (100.245 kg)  BMI 40.41 kg/m2  LMP 05/01/2013  Breastfeeding? No        General:  alert     Abdomen: soft, non-tender; bowel sounds normal; no masses,  no organomegaly   Vulva:  normal  Vagina: normal vagina  Cervix:  no lesions  Corpus: normal size, contour, position, consistency, mobility, non-tender  Adnexa:  normal adnexa   Assessment:   Postpartum depression  Plan:    1. Contraception: Nexplanon 2. Referral-->Behavioral Health 3. Follow up in: 3 months or as needed.

## 2013-05-15 NOTE — BH Assessment (Signed)
Pts vitals were taking prior to the MSE being done Nanine Means NP  HR 71 Temp 97.9 Resp 18 BP 112/76
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
## 2013-05-15 NOTE — H&P (Signed)
Behavioral Health Medical Screening Exam  Carrie Massey is an 27 y.o. female.  Review of Systems  Constitutional: Negative.   HENT: Negative.   Eyes: Negative.   Respiratory: Negative.   Cardiovascular: Negative.   Gastrointestinal: Negative.   Genitourinary: Negative.   Musculoskeletal: Negative.   Skin: Negative.   Neurological: Negative.   Endo/Heme/Allergies: Negative.   Psychiatric/Behavioral: Negative.   Denies SI, HI, Psychosis.   Physical Exam  Constitutional: She is oriented to person, place, and time. She appears well-nourished.  HENT:  Head: Normocephalic.  Eyes: Conjunctivae and EOM are normal. Pupils are equal, round, and reactive to light.  Neck: Normal range of motion. Neck supple.  Cardiovascular: Normal rate, regular rhythm, normal heart sounds and intact distal pulses.   Respiratory: Effort normal and breath sounds normal.  GI: Soft. Bowel sounds are normal.  Musculoskeletal: Normal range of motion.  Neurological: She is alert and oriented to person, place, and time. She has normal reflexes.  Skin: Skin is warm and dry.  Psychiatric: She has a normal mood and affect. Her behavior is normal. Judgment and thought content normal.    Last menstrual period 05/01/2013, not currently breastfeeding.  Recommendations:  Based on my evaluation the patient does not appear to have an emergency medical condition.   Beau Fanny, FNP-BC 05/15/2013, 7:08 PM Agree with assessment and plan Madie Reno A.Dub Mikes, M.D.
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
## 2013-05-16 ENCOUNTER — Other Ambulatory Visit: Payer: Self-pay | Admitting: *Deleted

## 2013-05-16 DIAGNOSIS — F53 Postpartum depression: Secondary | ICD-10-CM

## 2013-05-16 LAB — WET PREP BY MOLECULAR PROBE
Candida species: NEGATIVE
Trichomonas vaginosis: NEGATIVE

## 2013-05-16 LAB — GC/CHLAMYDIA PROBE AMP: GC Probe RNA: NEGATIVE

## 2013-05-16 MED ORDER — SERTRALINE HCL 50 MG PO TABS: 50.0000 mg | ORAL_TABLET | Freq: Every day | ORAL | Status: DC

## 2013-05-17 ENCOUNTER — Encounter: Payer: Self-pay | Admitting: Obstetrics

## 2013-05-17 ENCOUNTER — Ambulatory Visit: Payer: BC Managed Care – PPO | Admitting: Obstetrics

## 2013-05-26 ENCOUNTER — Ambulatory Visit (HOSPITAL_COMMUNITY)
Admission: RE | Admit: 2013-05-26 | Discharge: 2013-05-26 | Disposition: A | Discharge status: Home / Self Care | Payer: BC Managed Care – PPO | Attending: Psychiatry | Admitting: Psychiatry

## 2013-05-26 NOTE — BH Assessment (Signed)
Assessment Note  Carrie Massey is an 28 y.o. female. [facility] with C/O Post Partum Depression. Pt reports that she has been feeling overwhelmed. Today she became so overwhelmed at work she couldn't concentrate, focus, or complete her job. She works as a Research officer, trade unionteachers assistant. Says that she doesn't usually feel as bad as she became today, especially at work b/c she loves her job. Pt however reports that she cries all the time. Pt reports that she informed her step mom that she was stressed and needed help with the baby. Pt reports that her step mom stepped in to help with the baby. Now that she is getting help she continues have issues with the fathers of her 2 daughters. Pt reports that she delivered her baby 9 weeks ago. Pt reports everything in her social life was okay until she enforced a child support order for FOB. Pt reports since enforcing a Child Support order that FOB has been treating her badly,disrespecting her and is denying that her baby is his child and told her he did not want her to have the baby anyway. Pt reports FOB informed her that he had sex with someone else the day after she delivered the baby. Pt reports that she does not deserve to be treated this way after being in a relationship with the Massey of her  Carrie ParishYoungest child's Massey for 2 years. Pt reports on-going issues with the Massey of her baby having infidelity issues.Pt reports decreased sleep,weight loss, and poor appetite. Pt reports that she loves her children and would never hurt them but has difficulty coping with her issues with the Massey of her Carrie child. Pt reports that she feels abandoned by him. Pt reports on-going stress and issues throughout her pregnancy. Pt reports that she loss 2 vehicles and was evicted from 2 apartments because of him. Although she now lives with her Massey and stepmother she continuous to think how her life has been effected by her Carrie Massey. Pt reports that her stress  and depression became more than she could handle during her pregnancy and she was prescribed Wellbutrin by her OB GYN Dr. Clearance CootsHarper. Pt reports that she discontinued her medication during her pregnancy because she would forget to take it. Since giving birth she has taken her medication-Zoloft as prescribed. Pt denies SI,HI, and no AVH reported. Pt is able to contract for safety. Psych IOP offered and declined by patient. Pt was provided with crisis resources and outpatient referrals for follow up. Sts that during her last visit she was given a referral to [facility] for therapy. She plans to follow up with that plan asap.    Axis I: [place] Depression and Mood Disorder Axis II: Deferred Axis III:  Past Medical History  Diagnosis Date  . Abnormal Pap smear     repeat WNL  . Hypertension     Not during pregnancy - not taking meds  . Hyperthyroidism     Prior to pregnancy - no meds  . Mental disorder     Depression  . Depression   . Postpartum depression    Axis IV: problems related to social environment, problems with access to health care services and problems with primary support group Axis V: 31-40 impairment in reality testing  Past Medical History:  Past Medical History  Diagnosis Date  . Abnormal Pap smear     repeat WNL  . Hypertension     Not during pregnancy - not  taking meds  . Hyperthyroidism     Prior to pregnancy - no meds  . Mental disorder     Depression  . Depression   . Postpartum depression     Past Surgical History  Procedure Laterality Date  . Cesarean section      Family History:  Family History  Problem Relation Age of Onset  . Anesthesia problems Neg Hx   . Diabetes Massey   . Diabetes Paternal Aunt     Social History:  reports that she has never smoked. She has never used smokeless tobacco. She reports that she does not drink alcohol or use illicit drugs.  Additional Social History:  Alcohol / Drug Use Pain Medications:  SEE MAR Prescriptions: SEE MAR Over the Counter: SEE MAR History of alcohol / drug use?: Yes Substance #1 Name of Substance 1: THC 1 - Age of First Use: 28 yrs old  1 - Amount (size/oz): varies; "a joint" 1 - Frequency: occasional use; "when I am stressed" 1 - Duration: on-going  1 - Last Use / Amount: 3 days ago   CIWA:   COWS:    Allergies:  Allergies  Allergen Reactions  . Penicillins Hives  . Percocet [Oxycodone-Acetaminophen] Hives    Home Medications:  (Not in a hospital admission)  OB/GYN Status:  Patient's last menstrual period was 05/01/2013.  General Assessment Data Location of Assessment: [facility] Is this a Tele or Face-to-Face Assessment?: Face-to-Face Is this an Initial Assessment or a Re-assessment for this encounter?: Initial Assessment Living Arrangements: Other relatives (living with 2 daughters, step mom, and Massey ) Can pt return to current living arrangement?: Yes Admission Status: Voluntary Is patient capable of signing voluntary admission?: Yes Transfer from: Acute Hospital Referral Source: Other     [facility] Crisis Care Plan Living Arrangements: Other relatives (living with 2 daughters, step mom, and Massey ) Name of Psychiatrist: No Current Provider ([facility] ) Name of Therapist:  ([facility] )  Education Status Is patient currently in school?: No  Risk to self Suicidal Ideation: No Suicidal Intent: No Is patient at risk for suicide?: No Suicidal Plan?: No Access to Means: No What has been your use of drugs/alcohol within the last 12 months?:  (pt reports occasional THC use ) Previous Attempts/Gestures: No How many times?:  (0) Other Self Harm Risks:  (none reported) Triggers for Past Attempts: None known Intentional Self Injurious Behavior: None Family Suicide History: No Recent stressful life event(s): Other (Comment) (pt reports issues with the fathers of her children) Persecutory  voices/beliefs?: No Depression: Yes Depression Symptoms: Feeling worthless/self pity;Feeling angry/irritable;Loss of interest in usual pleasures;Tearfulness;Fatigue Substance abuse history and/or treatment for substance abuse?: No Suicide prevention information given to non-admitted patients: Not applicable  Risk to Others Homicidal Ideation: No Thoughts of Harm to Others: No Current Homicidal Intent: No Current Homicidal Plan: No Access to Homicidal Means: No Identified Victim:  (n/a) History of harm to others?: No Assessment of Violence: None Noted Violent Behavior Description:  (patient is calm and cooperative ) Does patient have access to weapons?: No Criminal Charges Pending?: No Does patient have a court date: No  Psychosis Hallucinations: None noted Delusions: None noted  Mental Status Report Appear/Hygiene: Disheveled Eye Contact: Good Motor Activity: Freedom of movement Speech: Logical/coherent Level of Consciousness: Alert Mood: Depressed Affect: Angry;Anxious;Depressed Anxiety Level: Minimal Thought Processes: Coherent;Relevant Judgement: Unimpaired Orientation: Person;Place;Time;Situation Obsessive Compulsive Thoughts/Behaviors: None  Cognitive Functioning Concentration: Normal Memory: Recent Intact;Remote Intact IQ:  Average Insight: Fair Impulse Control: Fair Appetite: Poor Weight Loss:  (50 pounds in 2 months ) Weight Gain:  (none reported ) Sleep: Decreased Total Hours of Sleep:  (3-4 hours per night ) Vegetative Symptoms: None  ADLScreening [facility] Assessment Services) Patient's cognitive ability adequate to safely complete daily activities?: Yes Patient able to express need for assistance with ADLs?: Yes Independently performs ADLs?: Yes (appropriate for developmental age)  Prior Inpatient Therapy Prior Inpatient Therapy: No Prior Therapy Dates: na Prior Therapy Facilty/Provider(s): na Reason for Treatment: na  Prior Outpatient  Therapy Prior Outpatient Therapy: Yes Prior Therapy Dates: na (currently) Prior Therapy Facilty/Provider(s): na ([facility]-in the process of making appt. ) Reason for Treatment: na (has not yet started services, requested appt. for  therapy)  ADL Screening (condition at time of admission) Patient's cognitive ability adequate to safely complete daily activities?: Yes Is the patient deaf or have difficulty hearing?: No Does the patient have difficulty seeing, even when wearing glasses/contacts?: No Does the patient have difficulty concentrating, remembering, or making decisions?: No Patient able to express need for assistance with ADLs?: Yes Does the patient have difficulty dressing or bathing?: No Independently performs ADLs?: Yes (appropriate for developmental age) Does the patient have difficulty walking or climbing stairs?: No Weakness of Legs: None Weakness of Arms/Hands: None  Home Assistive Devices/Equipment Home Assistive Devices/Equipment: None    Abuse/Neglect Assessment (Assessment to be complete while patient is alone) Physical Abuse: Denies Verbal Abuse: Denies Sexual Abuse: Denies Exploitation of patient/patient's resources: Denies Self-Neglect: Denies Values / Beliefs Cultural Requests During Hospitalization: None Spiritual Requests During Hospitalization: None   Advance Directives (For Healthcare) Advance Directive: Patient does not have advance directive Nutrition Screen- [facility] Adult/WL/AP Patient's home diet: Regular  Additional Information 1:1 In Past 12 Months?: No CIRT Risk: No Elopement Risk: No Does patient have medical clearance?: No     Disposition:  Disposition Initial Assessment Completed for this Encounter: Yes Disposition of Patient: Outpatient treatment;Treatment offered and refused Type of outpatient treatment: Adult (current provider-[facility] ) Type of treatment offered and refused: Intensive outpatient (Psych IOP here  at [facility])  On Site Evaluation by:   Reviewed with Physician:    Melynda Ripple [facility] 05/26/2013 6:05 PM

## 2013-07-20 ENCOUNTER — Other Ambulatory Visit: Payer: Self-pay | Admitting: *Deleted

## 2013-07-20 MED ORDER — SERTRALINE HCL 50 MG PO TABS: 50.0000 mg | ORAL_TABLET | Freq: Every day | ORAL | Status: DC

## 2013-07-26 ENCOUNTER — Encounter: Payer: Self-pay | Admitting: Obstetrics

## 2013-08-14 ENCOUNTER — Ambulatory Visit: Payer: BC Managed Care – PPO | Admitting: Obstetrics

## 2013-09-19 IMAGING — US US PELVIS COMPLETE
1 series · 14 of 25 positions shown · non-contrast
Comparison: 10/25/2011

CLINICAL DATA: Pelvic pain.

TRANSABDOMINAL AND TRANSVAGINAL ULTRASOUND OF PELVIS
TECHNIQUE: Both transabdominal and transvaginal ultrasound
examinations of the pelvis were performed including evaluation of
the uterus, ovaries, adnexal regions, and pelvic cul-de-sac.

[Series 1: us pelvis complete · 47 acquisitions, 14 frames shown]
[im 1/47]
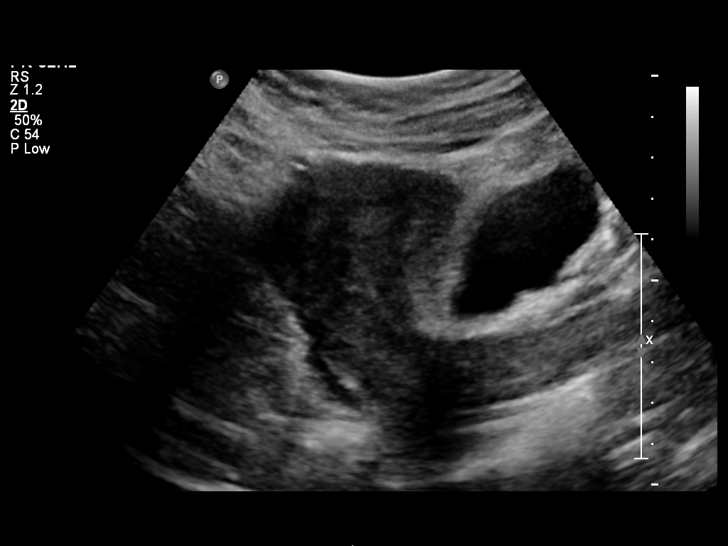
[im 4/47]
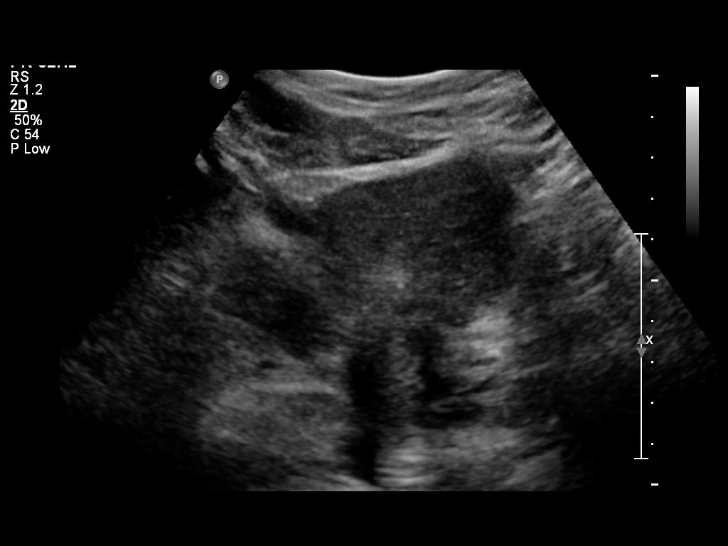
[im 8/47]
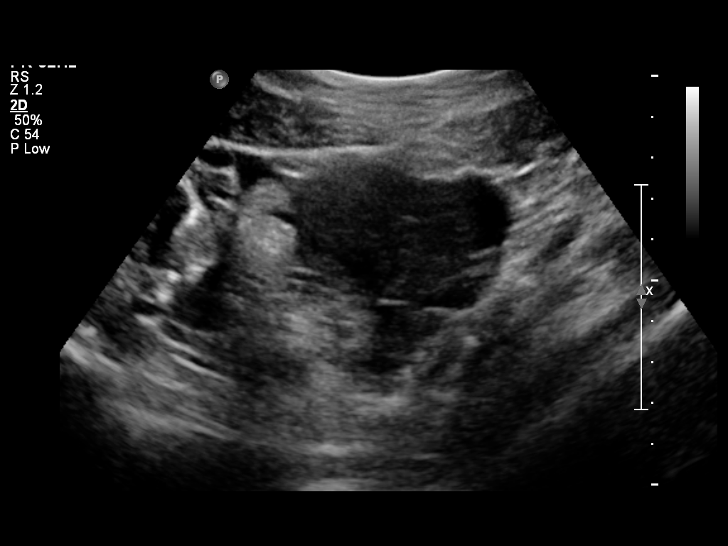
[im 12/47]
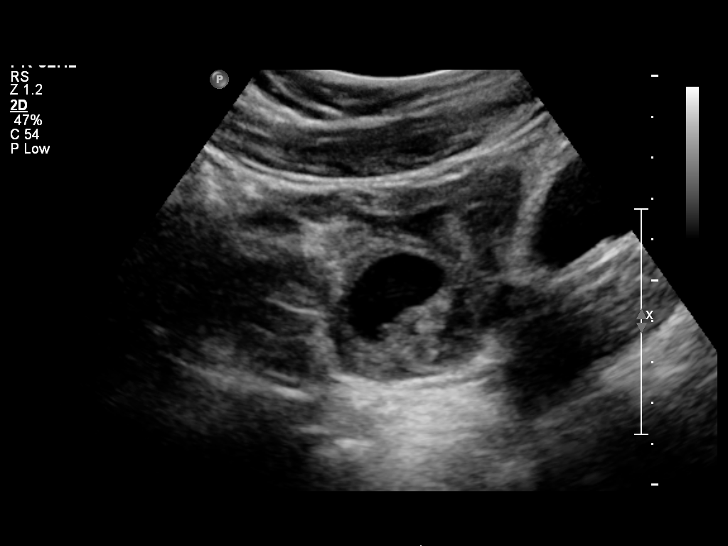
[im 16/47]
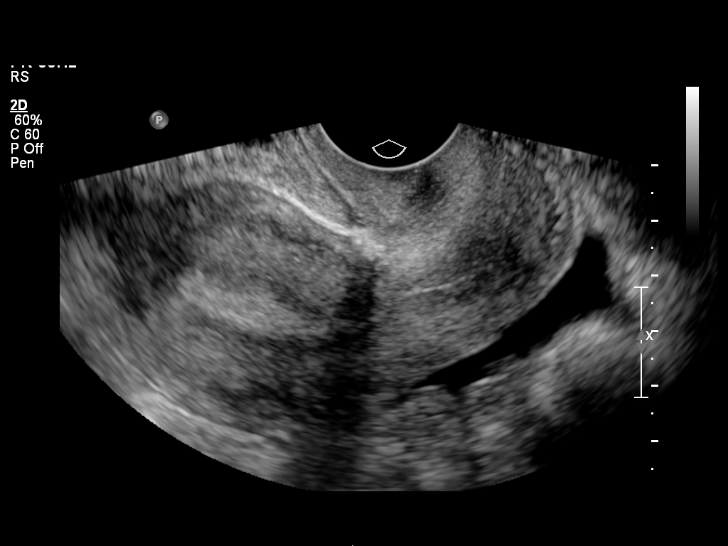
[im 18/47]
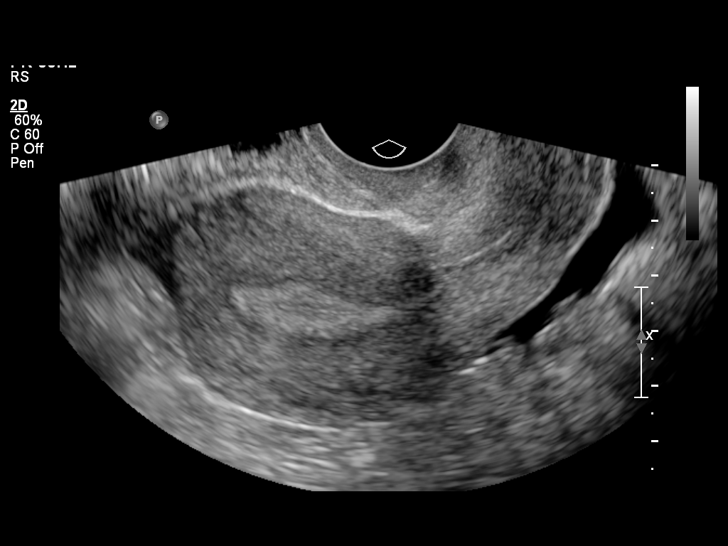
[im 22/47]
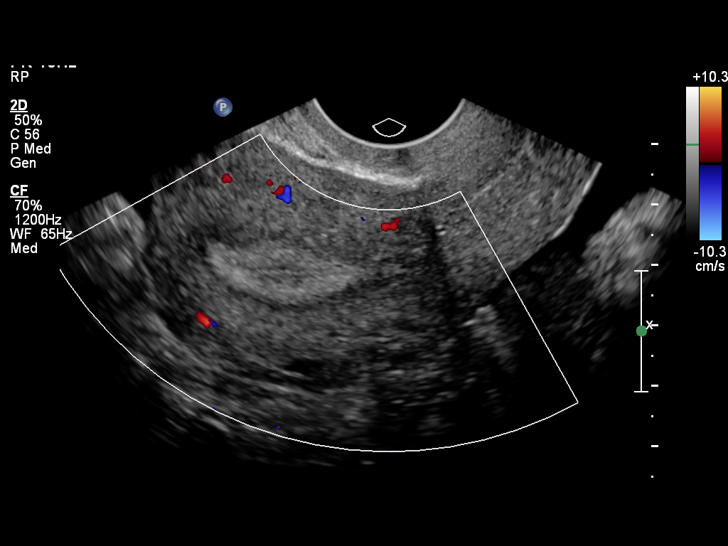
[im 25/47]
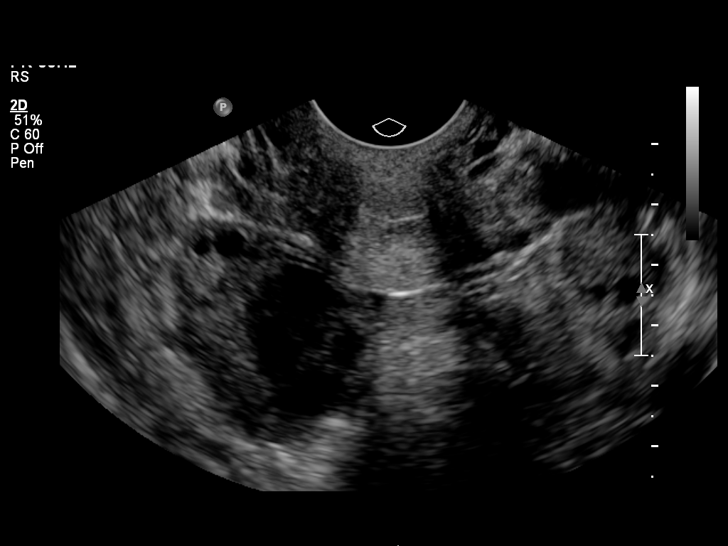
[im 29/47]
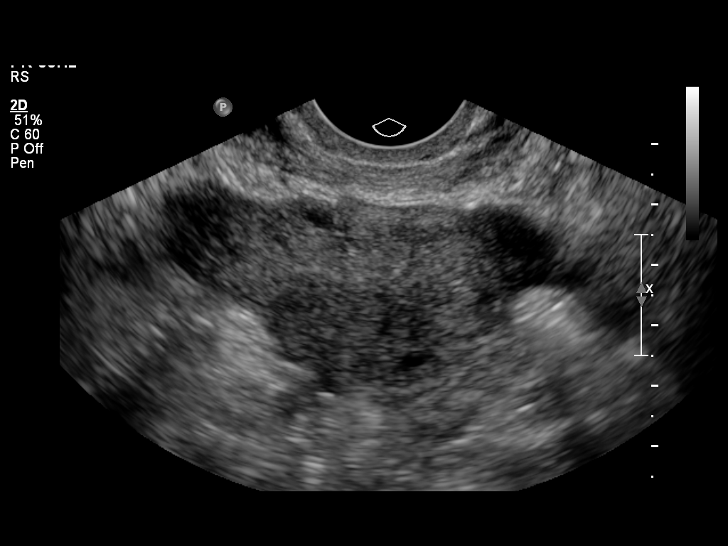
[im 31/47]
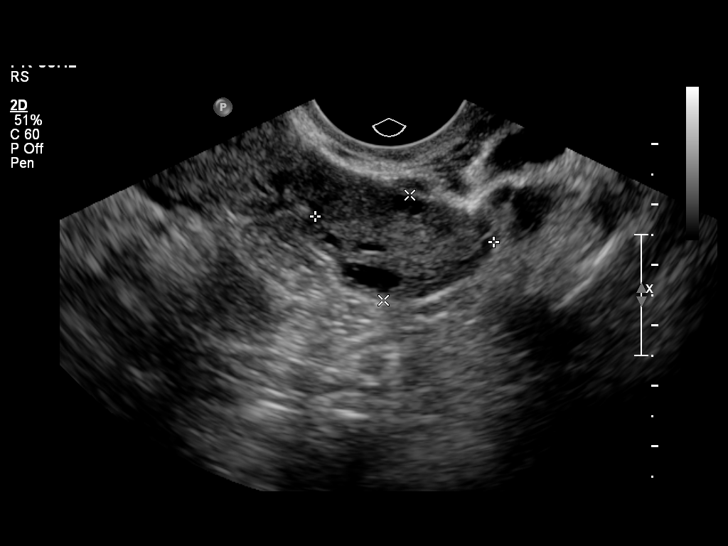
[im 35/47]
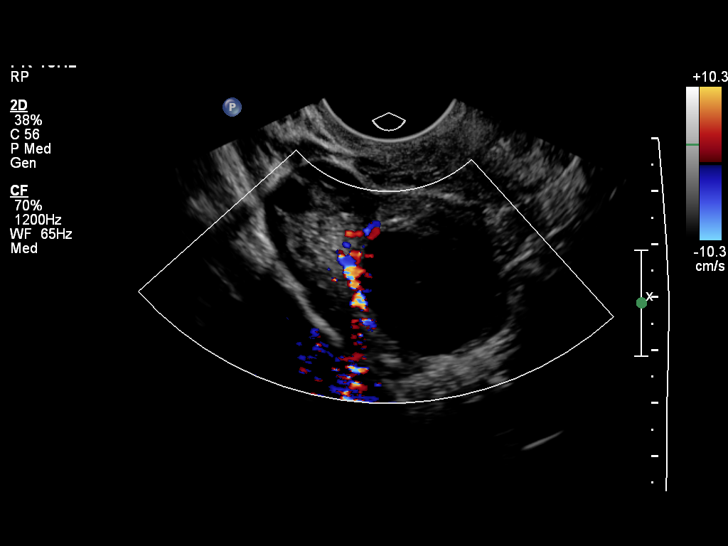
[im 39/47]
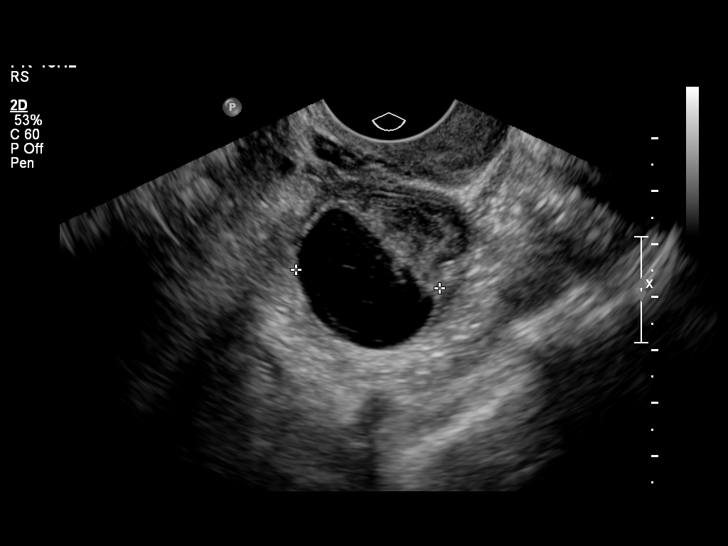
[im 43/47]
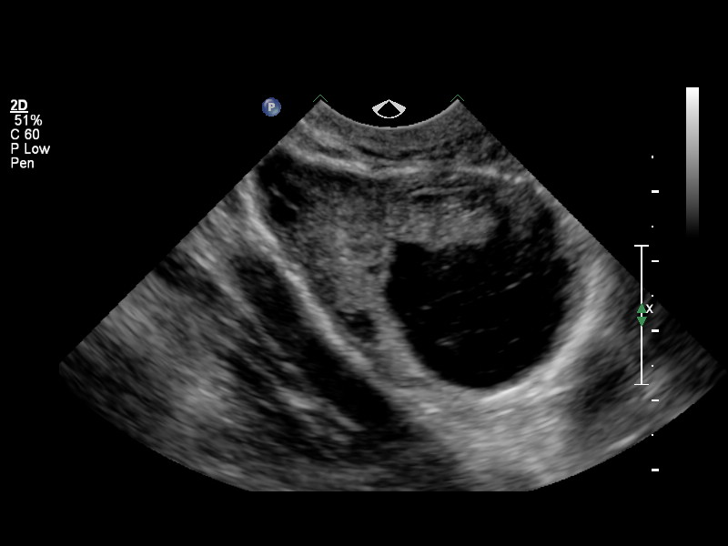
[im 47/47]
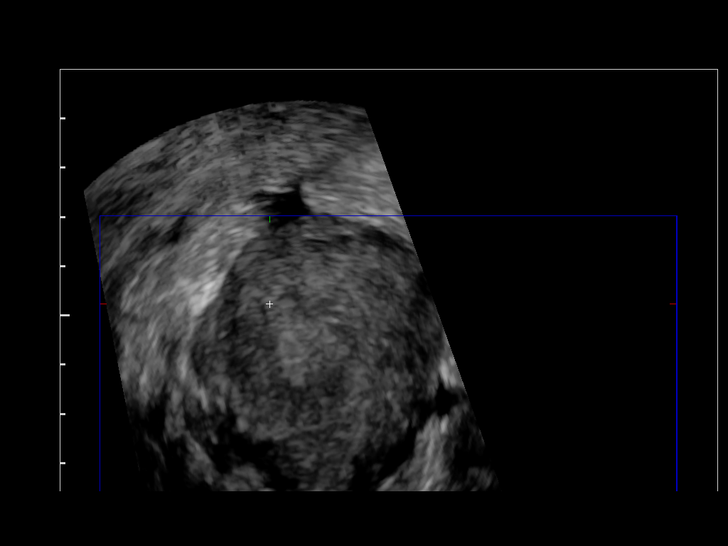

[14 of 25 positions shown; findings below may reference images not displayed]

FINDINGS: Uterus: 8.0 x 3.9 x 5.3 cm.  Normal echotexture.  No focal
abnormality.

Endometrium: Normal appearance and thickness, 9 mm.

Right Ovary: 5.0 x 3.4 x 3.5 cm.  2.7 cm minimally complex cyst in
the right ovary, likely hemorrhagic corpus luteal cyst.  No adnexal
masses.

Left Ovary: 3.0 x 1.8 x 2.2 cm. Normal size and echotexture.  No
adnexal masses.

Other Findings:  Small amount of free fluid in the cul-de-sac,
likely physiologic.
IMPRESSION: Small complex right corpus luteal cyst.  No acute findings
otherwise.

## 2013-10-12 IMAGING — US US OB TRANSVAGINAL
1 series · 14 of 28 positions shown · non-contrast
Comparison: 07/17/2012

CLINICAL DATA: OBSTETRIC <14 WK ULTRASOUND
TECHNIQUE: Transvaginal ultrasound was performed for evaluation
of the gestation as well as the maternal uterus and adnexal
regions.

[Series 1: us ob comp less 14 wks · 14 of 34 slices shown]
[im 2/34]
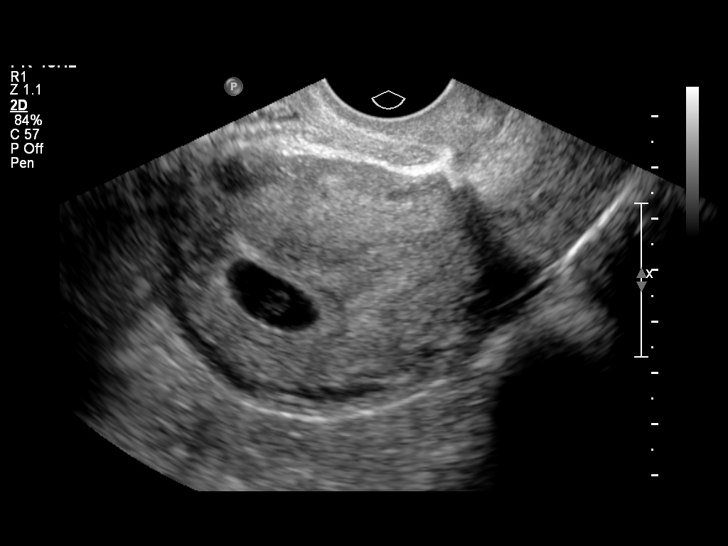
[im 4/34]
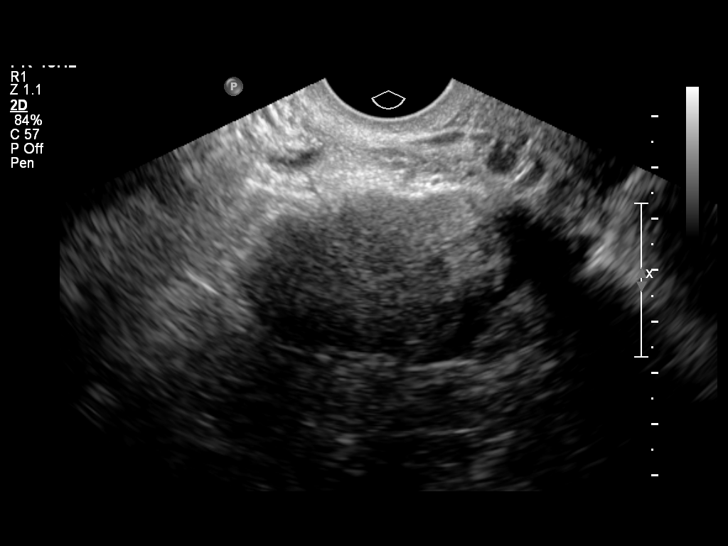
[im 7/34]
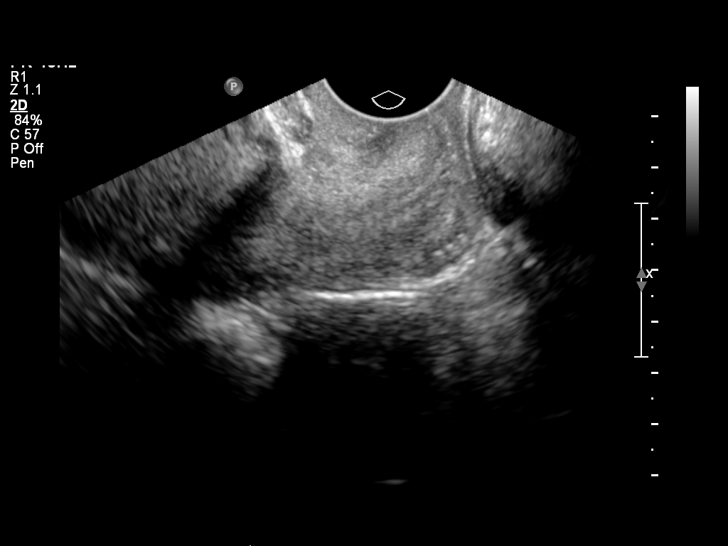
[im 9/34]
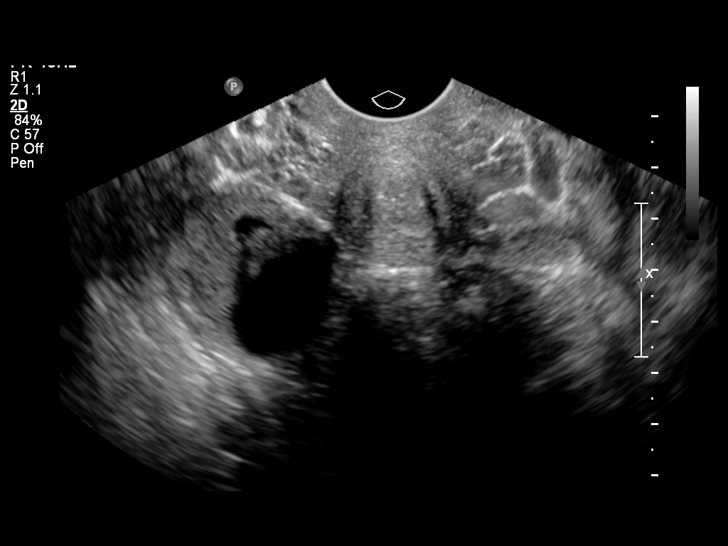
[im 12/34]
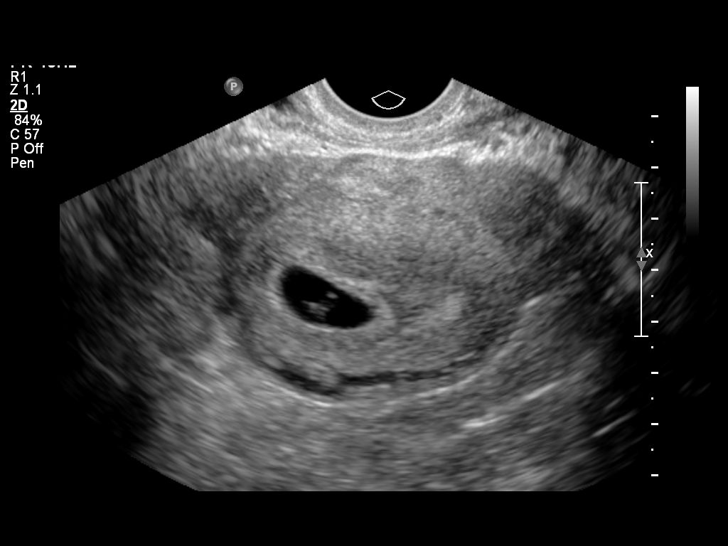
[im 14/34]
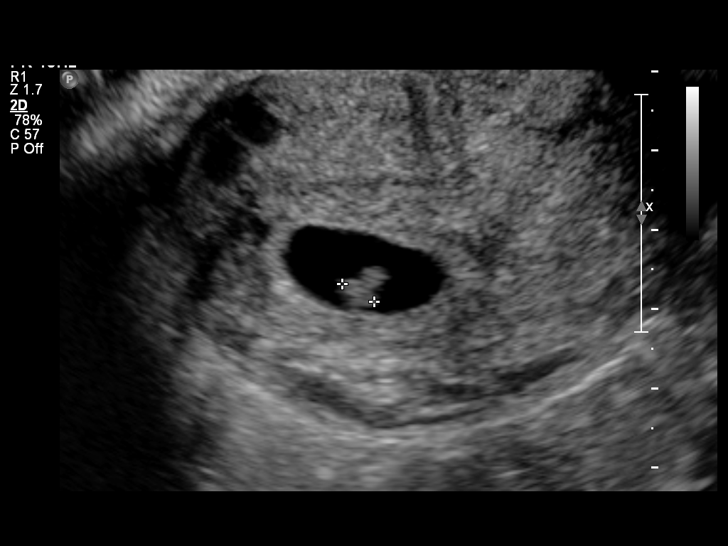
[im 16/34]
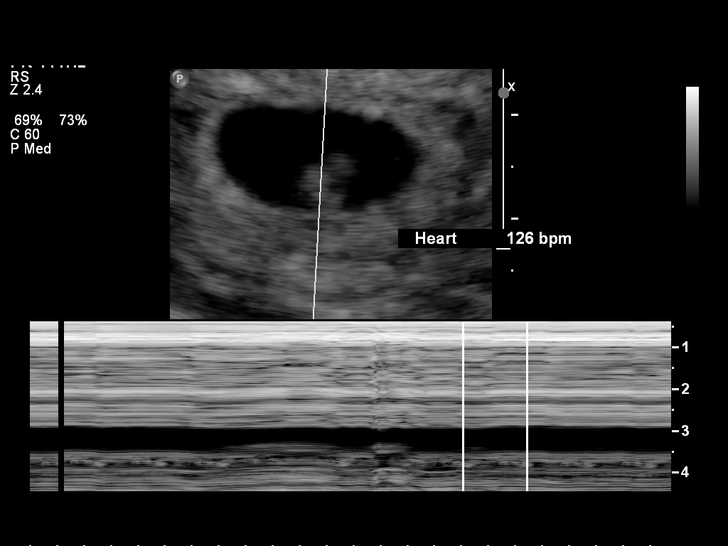
[im 19/34]
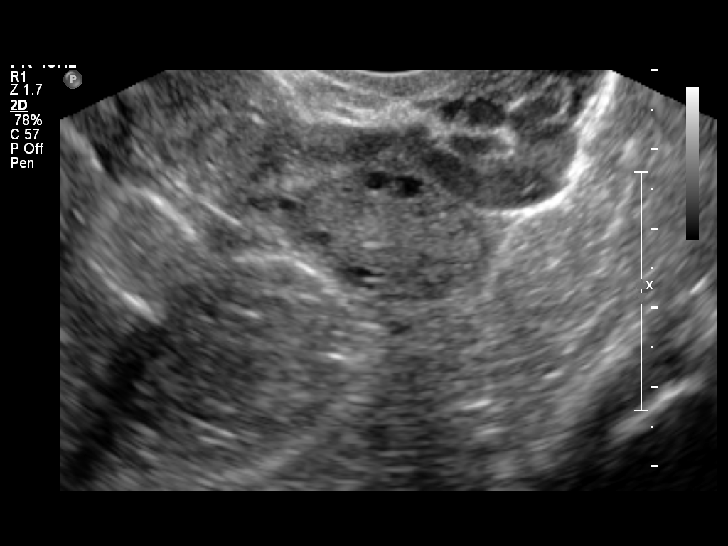
[im 21/34]
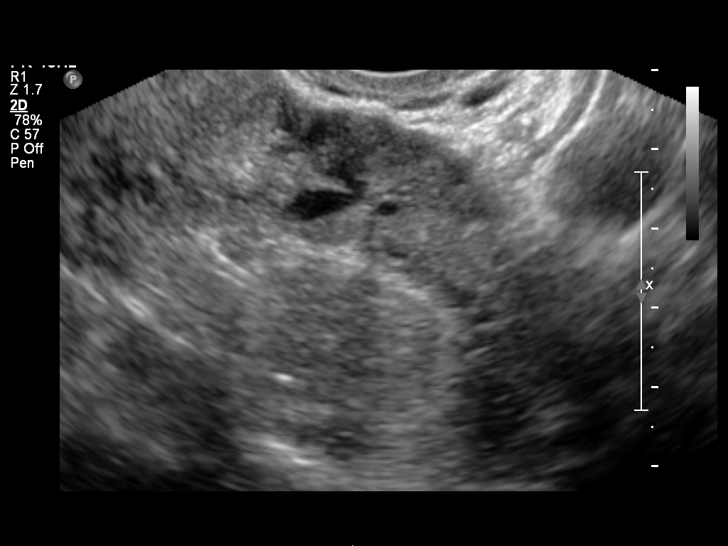
[im 24/34]
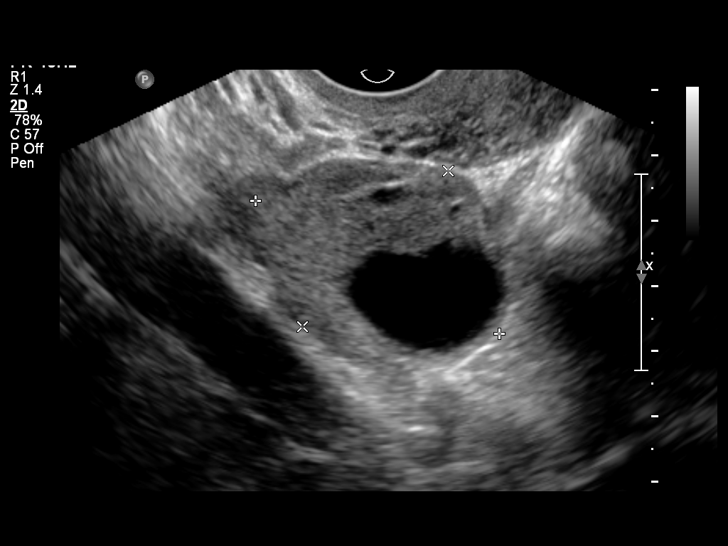
[im 26/34]
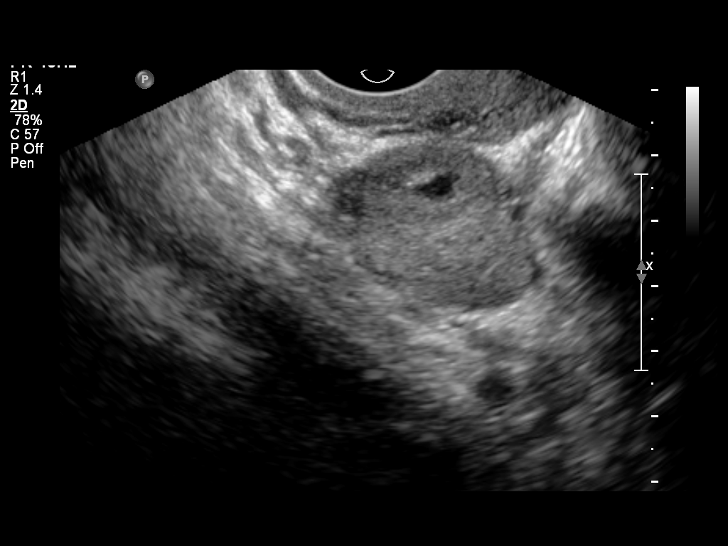
[im 29/34]
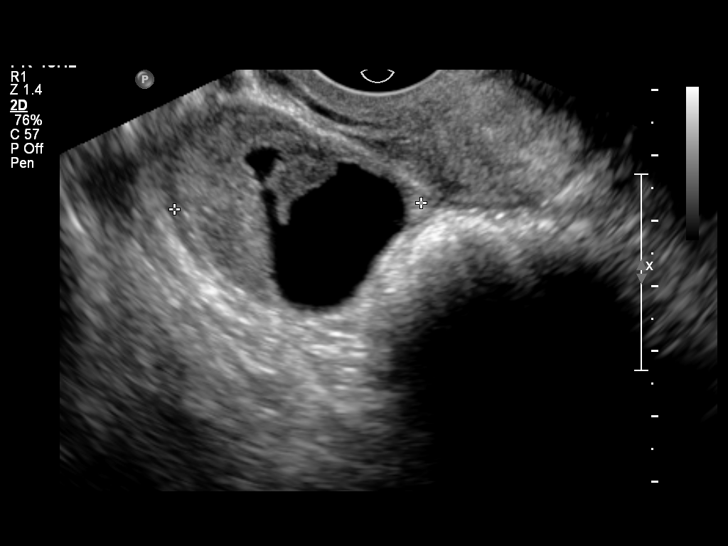
[im 31/34]
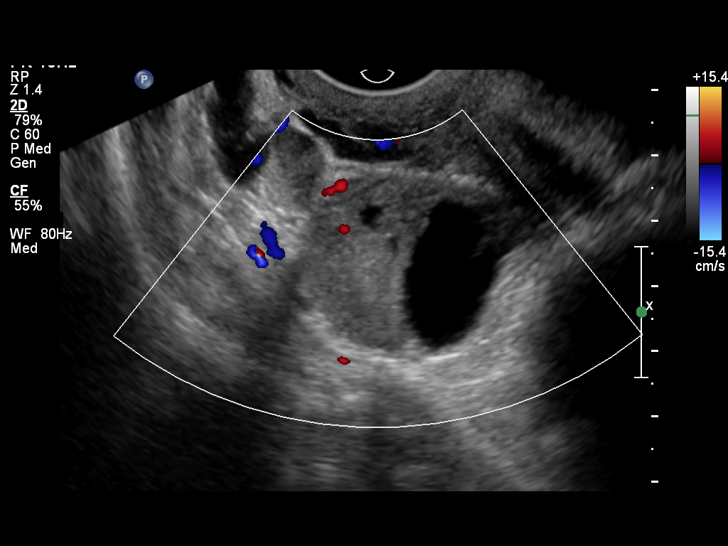
[im 34/34]
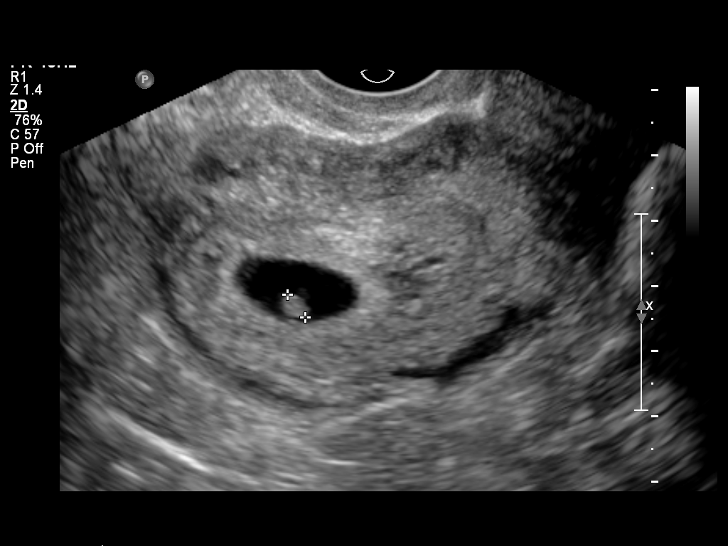

[14 of 28 positions shown; findings below may reference images not displayed]

Intrauterine gestational sac: Visualized/normal in shape.
Yolk sac: Present
Embryo: Present
Cardiac Activity: Present
Heart Rate: 126 bpm

CRL:  4.5 mm  six w  two d          US EDC: 04/02/2013.

Maternal uterus/Adnexae:
No subchorionic hemorrhage.
Right-sided corpus luteum cyst.
Normal left ovary.
No free pelvic fluid collections.
IMPRESSION: Single living intrauterine embryo estimated at 6 weeks and 2 days
gestation.
No subchorionic hemorrhage.

## 2014-03-19 ENCOUNTER — Encounter: Payer: Self-pay | Admitting: Obstetrics & Gynecology

## 2014-05-14 ENCOUNTER — Encounter: Payer: Self-pay | Admitting: *Deleted

## 2014-05-15 ENCOUNTER — Encounter: Payer: Self-pay | Admitting: Obstetrics & Gynecology

## 2014-11-06 ENCOUNTER — Encounter: Payer: 59 | Attending: Physician Assistant | Admitting: Dietician

## 2014-11-06 ENCOUNTER — Encounter: Payer: Self-pay | Admitting: Dietician

## 2014-11-06 VITALS — Ht 62.5 in | Wt 235.8 lb

## 2014-11-06 DIAGNOSIS — E669 Obesity, unspecified: Secondary | ICD-10-CM | POA: Diagnosis not present

## 2014-11-06 DIAGNOSIS — Z6841 Body Mass Index (BMI) 40.0 and over, adult: Secondary | ICD-10-CM | POA: Insufficient documentation

## 2014-11-06 DIAGNOSIS — Z713 Dietary counseling and surveillance: Secondary | ICD-10-CM | POA: Diagnosis not present

## 2014-11-06 NOTE — Patient Instructions (Addendum)
Goal weight: 180s Healthy weight loss is 1-2 lbs  -Keep working on being as active as possible AS TOLERATED  -Work on eating 3 meals a day everyday   -Have a protein food with each meal and snack  -Practice mindful eating  -Fill up on non starchy vegetables (follow plate method)  -Avoid skipping breakfast   -Keep healthy snacks on hand at work: yogurt, low fat cheese, nuts  -Try a Premier protein shake for breakfast or when you don't feel like eating a meal  -Continue working with your therapist about emotional eating  [email or web address]

## 2014-11-06 NOTE — Progress Notes (Signed)
  Medical Nutrition Therapy:  Appt start time: 415 end time:  515   Assessment:  Primary concerns today: Carrie Massey is here today to discuss her weight. She reports she gets depressed about her weight. 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 She is currently in "depression mode." She feels like this is due to her birth control. She thinks she sometimes binge-eats and eats for emotional reasons. This usually happens at night. She may then go several days without eating. Carrie Massey has a therapist and has discussed this with her. Her goal weight is 180 lbs. She recently lost 30 pounds. She reports that she has always been a "big girl."  She is interested in an appetite suppressant. Carrie Massey is a single mom of 2 young girls. She does not have a kitchen table. They usually eat in the living room. She likes to read in her free time. She is interested in bariatric surgery.   Preferred Learning Style:  No preference indicated   Learning Readiness:  Ready  MEDICATIONS: none   DIETARY INTAKE:  Avoided foods include onions, tomatoes, okra, asparagus.    24-hr recall:  B ( AM): coffee with cream and sugar, sometimes a granola bar   Snk ( AM):   L ( PM): leftovers Snk ( PM): popcorn D ( PM): spaghetti  Snk ( PM): sometimes cookies or chocolate  Beverages: water, coffee, Kool Aid, skim, 1%, or 2% milk  Usual physical activity: walking at the park 1x a week  Estimated energy needs: 1800-2000 calories  Progress Towards Goal(s):  In progress.   Nutritional Diagnosis:  -3.3 Overweight/obesity As related to history of erratic meal patterns, physical inactivity, and excessive energy intake.  As evidenced by BMI 42.5.    Intervention:  Nutrition education provided. Provided link to bariatric seminar and support group handouts. Encouraged the patient to explore options and learn more about bariatric surgery. Goals: Goal weight: 180s Healthy weight loss is 1-2 lbs -Keep working on  being as active as possible AS TOLERATED -Work on eating 3 meals a day everyday   -Have a protein food with each meal and snack  -Practice mindful eating  -Fill up on non starchy vegetables (follow plate method)  -Avoid skipping breakfast   -Keep healthy snacks on hand at work: yogurt, low fat cheese, nuts -Try a Premier protein shake for breakfast or when you don't feel like eating a meal -Continue working with your therapist about emotional eating [email or web address]  Teaching Method Utilized:  Scientific laboratory technician Hands on  Handouts given during visit include:  MyPlate  Bariatric support group times  Barriers to learning/adherence to lifestyle change: single mom and busy schedule  Demonstrated degree of understanding via:  Teach Back   Monitoring/Evaluation:  Dietary intake, exercise, and body weight in 4 week(s).

## 2014-12-05 ENCOUNTER — Encounter: Payer: 59 | Attending: Physician Assistant | Admitting: Dietician

## 2014-12-05 VITALS — Wt 237.7 lb

## 2014-12-05 DIAGNOSIS — Z713 Dietary counseling and surveillance: Secondary | ICD-10-CM | POA: Insufficient documentation

## 2014-12-05 DIAGNOSIS — E669 Obesity, unspecified: Secondary | ICD-10-CM | POA: Diagnosis not present

## 2014-12-05 DIAGNOSIS — Z6841 Body Mass Index (BMI) 40.0 and over, adult: Secondary | ICD-10-CM | POA: Diagnosis not present

## 2014-12-05 NOTE — Progress Notes (Signed)
  Medical Nutrition Therapy:  Appt start time: 330 end time:  400   Follow up:  Primary concerns today: Alto DenverShawntease returns today having gained 1 pound. She reports that she is very frustrated because she feels like she is really trying to lose weight. She is trying to be more active. However, she continues to skip meals during the day and overeat at night. She went to the bariatric seminar. She states that she is considering surgery but would really like to lose the weight on her own.  Preferred Learning Style:  No preference indicated   Learning Readiness:  Ready  MEDICATIONS: none   DIETARY INTAKE:  Avoided foods include onions, tomatoes, okra, asparagus.    24-hr recall:  B ( AM): coffee with cream and sugar, sometimes a granola bar   Snk ( AM):   L ( PM): leftovers Snk ( PM): popcorn D ( PM): spaghetti  Snk ( PM): sometimes cookies or chocolate  Beverages: water, coffee, Kool Aid, skim, 1%, or 2% milk  Usual physical activity: walking at the park 1x a week  Estimated energy needs: 1800-2000 calories  Progress Towards Goal(s):  In progress.   Nutritional Diagnosis:  [place]-3.3 Overweight/obesity As related to history of erratic meal patterns, physical inactivity, and excessive energy intake.  As evidenced by BMI 42.5.    Intervention:  Nutrition education provided. Provided link to bariatric seminar and support group handouts. Encouraged the patient to explore options and learn more about bariatric surgery. Goals: Goal weight: 180s Healthy weight loss is 1-2 lbs -Keep working on being as active as possible AS TOLERATED -Work on eating 3 meals a day everyday   -Have a protein food with each meal and snack  -Practice mindful eating  -Fill up on non starchy vegetables (follow plate method)  -Avoid skipping breakfast   -Keep healthy snacks on hand at work: yogurt, low fat cheese, nuts -Try a Premier protein shake for breakfast or when you don't feel like eating a  meal -Continue working with your therapist about emotional eating [email or web address]  Teaching Method Utilized:  Visual Auditory  Barriers to learning/adherence to lifestyle change: single mom and busy schedule  Demonstrated degree of understanding via:  Teach Back   Monitoring/Evaluation:  Dietary intake, exercise, and body weight in 4 week(s).

## 2014-12-05 NOTE — Patient Instructions (Addendum)
Goal weight: 180s Healthy weight loss is 1-2 lbs per week  -Keep working on being as active as possible AS TOLERATED  -Work on eating 3 meals a day everyday   -Have a protein food with each meal and snack  -Practice mindful eating  -Fill up on non starchy vegetables (follow plate method)  -Avoid skipping breakfast   -Keep healthy snacks on hand at work: yogurt, low fat cheese, nuts  -Try a Premier protein shake for breakfast or when you don't feel like eating a meal  -Continue working with your therapist about emotional eating  [email or web address]

## 2014-12-06 ENCOUNTER — Encounter: Payer: Self-pay | Admitting: Dietician

## 2015-01-08 ENCOUNTER — Ambulatory Visit: Payer: Self-pay | Admitting: Dietician

## 2015-01-24 ENCOUNTER — Encounter: Payer: Self-pay | Admitting: Dietician

## 2015-01-24 ENCOUNTER — Encounter: Payer: 59 | Attending: Physician Assistant | Admitting: Dietician

## 2015-01-24 VITALS — Wt 237.1 lb

## 2015-01-24 DIAGNOSIS — Z6841 Body Mass Index (BMI) 40.0 and over, adult: Secondary | ICD-10-CM | POA: Insufficient documentation

## 2015-01-24 DIAGNOSIS — E669 Obesity, unspecified: Secondary | ICD-10-CM

## 2015-01-24 DIAGNOSIS — Z713 Dietary counseling and surveillance: Secondary | ICD-10-CM | POA: Diagnosis not present

## 2015-01-24 NOTE — Progress Notes (Signed)
  Medical Nutrition Therapy:  Appt start time: 910 end time:  935   Follow up:  Primary concerns today: Carrie Massey returns today having lost 1 pound. Her kids started school last week and they are working on getting into a routine. She reports that she hates her job and feels like it affects her overall attitude. She is not enjoying sitting at a desk all day. 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 She has decided not to have bariatric surgery. She has been working on eating 3 meals a day.   Preferred Learning Style:  No preference indicated   Learning Readiness:  Ready  MEDICATIONS: none   DIETARY INTAKE:  Avoided foods include onions, tomatoes, okra, asparagus.    24-hr recall:  B ( AM): coffee with cream and sugar, sometimes a granola bar   Snk ( AM):   L ( PM): leftovers Snk ( PM): popcorn D ( PM): baked pork chop with rice and vegetables Snk ( PM): sometimes cookies or chocolate  Beverages: water, coffee, Kool Aid, skim, 1%, or 2% milk  Usual physical activity: walking/playing at the park every afternoon; walking during breaks at work  Estimated energy needs: 1800-2000 calories  Progress Towards Goal(s):  In progress.   Nutritional Diagnosis:  [place]-3.3 Overweight/obesity As related to history of erratic meal patterns, physical inactivity, and excessive energy intake.  As evidenced by BMI 42.5.    Intervention:  Nutrition education provided. Provided link to bariatric seminar and support group handouts. Encouraged the patient to explore options and learn more about bariatric surgery. Goals: Goal weight: 180s Healthy weight loss is 1-2 lbs -Keep working on being as active as possible AS TOLERATED -Work on eating 3 meals a day everyday   -Have a protein food with each meal and snack  -Practice mindful eating  -Fill up on non starchy vegetables (follow plate method)  -Avoid skipping breakfast   -Keep healthy snacks on hand at work:  yogurt, low fat cheese, nuts -Try a Premier protein shake for breakfast or when you don't feel like eating a meal -Continue working with your therapist about emotional eating [email or web address]  Teaching Method Utilized:  Visual Auditory  Barriers to learning/adherence to lifestyle change: single mom and busy schedule  Demonstrated degree of understanding via:  Teach Back   Monitoring/Evaluation:  Dietary intake, exercise, and body weight in 2 month(s).

## 2015-01-24 NOTE — Patient Instructions (Signed)
Goal weight: 180s Healthy weight loss is 1-2 lbs per week  -Keep working on being as active as possible AS TOLERATED  -Work on eating 3 meals a day everyday   -Have a protein food with each meal and snack  -Practice mindful eating  -Fill up on non starchy vegetables (follow plate method)  -Avoid skipping breakfast   -Keep healthy snacks on hand at work: yogurt, low fat cheese, nuts  -Try a Premier protein shake for breakfast or when you don't feel like eating a meal  -Continue working with your therapist about emotional eating  [email or web address] 

## 2015-03-25 ENCOUNTER — Ambulatory Visit: Payer: Self-pay | Admitting: Dietician

## 2015-06-27 ENCOUNTER — Ambulatory Visit: Payer: BC Managed Care – PPO | Admitting: Obstetrics

## 2015-07-09 ENCOUNTER — Ambulatory Visit (INDEPENDENT_AMBULATORY_CARE_PROVIDER_SITE_OTHER): Payer: BC Managed Care – PPO | Admitting: Obstetrics

## 2015-07-09 ENCOUNTER — Encounter: Payer: Self-pay | Admitting: Obstetrics

## 2015-07-09 VITALS — BP 106/72 | HR 80 | Wt 241.0 lb

## 2015-07-09 DIAGNOSIS — Z01419 Encounter for gynecological examination (general) (routine) without abnormal findings: Secondary | ICD-10-CM | POA: Diagnosis not present

## 2015-07-09 DIAGNOSIS — Z975 Presence of (intrauterine) contraceptive device: Secondary | ICD-10-CM

## 2015-07-10 ENCOUNTER — Encounter: Payer: Self-pay | Admitting: Obstetrics

## 2015-07-10 NOTE — Progress Notes (Signed)
Subjective:        Carrie Massey is a 30 y.o. female here for a routine exam.  Current complaints: None.    Personal health questionnaire:  Is patient Ashkenazi Jewish, have a family history of breast and/or ovarian cancer: no Is there a family history of uterine cancer diagnosed at age < 4, gastrointestinal cancer, urinary tract cancer, family member who is a Personnel officer syndrome-associated carrier: no Is the patient overweight and hypertensive, family history of diabetes, personal history of gestational diabetes, preeclampsia or PCOS: no Is patient over 29, have PCOS,  family history of premature CHD under age 80, diabetes, smoke, have hypertension or peripheral artery disease:  no At any time, has a partner hit, kicked or otherwise hurt or frightened you?: no Over the past 2 weeks, have you felt down, depressed or hopeless?: no Over the past 2 weeks, have you felt little interest or pleasure in doing things?:no   Gynecologic History Patient's last menstrual period was 04/17/2015. Contraception: Nexplanon Last Pap: unknown. Results were: unknown Last mammogram: n/a. Results were: n/a  Obstetric History OB History  Gravida Para Term Preterm AB SAB TAB Ectopic Multiple Living  # Outcome Date GA Lbr Len/2nd Weight Sex Delivery Anes PTL Lv  3 Term 03/24/13 [redacted]w[redacted]d 96:51 / 02:24 6 lb 13.3 oz (3.099 kg) F VBAC EPI  Y  2 Term 03/15/09 [redacted]w[redacted]d  10 lb 1 oz (4.564 kg) F CS-LTranv Spinal  Y     Comments: macrosomia  1 SAB               Past Medical History  Diagnosis Date  . Abnormal Pap smear     repeat WNL  . Hypertension     Not during pregnancy - not taking meds  . Hyperthyroidism     Prior to pregnancy - no meds  . Mental disorder     Depression  . Depression   . Postpartum depression     Past Surgical History  Procedure Laterality Date  . Cesarean section      No current outpatient prescriptions on file. Allergies  Allergen Reactions  .  Penicillins Hives  . Percocet [Oxycodone-Acetaminophen] Hives    Social History  Substance Use Topics  . Smoking status: Never Smoker   . Smokeless tobacco: Never Used  . Alcohol Use: No    Family History  Problem Relation Age of Onset  . Anesthesia problems Neg Hx   . Diabetes Father   . Diabetes Paternal Aunt   . Diabetes Paternal Grandmother       Review of Systems  Constitutional: negative for fatigue and weight loss Respiratory: negative for cough and wheezing Cardiovascular: negative for chest pain, fatigue and palpitations Gastrointestinal: negative for abdominal pain and change in bowel habits Musculoskeletal:negative for myalgias Neurological: negative for gait problems and tremors Behavioral/Psych: negative for abusive relationship, depression Endocrine: negative for temperature intolerance   Genitourinary:negative for abnormal menstrual periods, genital lesions, hot flashes, sexual problems and vaginal discharge Integument/breast: negative for breast lump, breast tenderness, nipple discharge and skin lesion(s)    Objective:       BP 106/72 mmHg  Pulse 80  Wt 241 lb (109.317 kg)  LMP 04/17/2015 General:   alert  Skin:   no rash or abnormalities  Lungs:   clear to auscultation bilaterally  Heart:   regular rate and rhythm, S1, S2 normal, no murmur, click, rub or gallop  Breasts:   normal without suspicious masses, skin or nipple changes or axillary nodes  Abdomen:  normal findings: no organomegaly, soft, non-tender and no hernia  Pelvis:  External genitalia: normal general appearance Urinary system: urethral meatus normal and bladder without fullness, nontender Vaginal: normal without tenderness, induration or masses Cervix: normal appearance Adnexa: normal bimanual exam Uterus: anteverted and non-tender, normal size   Lab Review Urine pregnancy test Labs reviewed yes Radiologic studies reviewed no   Assessment:    Healthy female exam.    Contraceptive counseling and advice     Plan:    Education reviewed: low fat, low cholesterol diet, safe sex/STD prevention, self breast exams and weight bearing exercise. Contraception: options discussed. Follow up in: 1 year.   No orders of the defined types were placed in this encounter.   Orders Placed This Encounter  Procedures  . SureSwab, Vaginosis/Vaginitis Plus

## 2015-07-11 LAB — PAP IG W/ RFLX HPV ASCU

## 2015-07-13 ENCOUNTER — Other Ambulatory Visit: Payer: Self-pay | Admitting: Obstetrics

## 2015-07-13 DIAGNOSIS — B9689 Other specified bacterial agents as the cause of diseases classified elsewhere: Secondary | ICD-10-CM

## 2015-07-13 DIAGNOSIS — N760 Acute vaginitis: Principal | ICD-10-CM

## 2015-07-13 LAB — SURESWAB, VAGINOSIS/VAGINITIS PLUS
Atopobium vaginae: 7.5 Log (cells/mL)
C. ALBICANS, DNA: NOT DETECTED
C. GLABRATA, DNA: NOT DETECTED
C. PARAPSILOSIS, DNA: NOT DETECTED
C. trachomatis RNA, TMA: NOT DETECTED
C. tropicalis, DNA: NOT DETECTED
Gardnerella vaginalis: >8 Log (cells/mL)
LACTOBACILLUS SPECIES: NOT DETECTED Log (cells/mL)
N. GONORRHOEAE RNA, TMA: NOT DETECTED
T. vaginalis RNA, QL TMA: NOT DETECTED

## 2015-07-13 MED ORDER — TINIDAZOLE 500 MG PO TABS: 1000.0000 mg | ORAL_TABLET | Freq: Every day | ORAL | Status: DC

## 2015-08-02 ENCOUNTER — Encounter: Payer: Self-pay | Admitting: *Deleted

## 2016-04-14 ENCOUNTER — Inpatient Hospital Stay (HOSPITAL_COMMUNITY)
Admission: AD | Admit: 2016-04-14 | Discharge: 2016-04-14 | Disposition: A | Discharge status: Home / Self Care | Payer: BC Managed Care – PPO | Source: Ambulatory Visit | Attending: Family Medicine | Admitting: Family Medicine

## 2016-04-14 ENCOUNTER — Encounter (HOSPITAL_COMMUNITY): Payer: Self-pay | Admitting: *Deleted

## 2016-04-14 DIAGNOSIS — Z880 Allergy status to penicillin: Secondary | ICD-10-CM | POA: Insufficient documentation

## 2016-04-14 DIAGNOSIS — M79629 Pain in unspecified upper arm: Secondary | ICD-10-CM | POA: Diagnosis present

## 2016-04-14 DIAGNOSIS — Z885 Allergy status to narcotic agent status: Secondary | ICD-10-CM | POA: Diagnosis not present

## 2016-04-14 DIAGNOSIS — Z3046 Encounter for surveillance of implantable subdermal contraceptive: Secondary | ICD-10-CM | POA: Diagnosis not present

## 2016-04-14 DIAGNOSIS — M79622 Pain in left upper arm: Secondary | ICD-10-CM | POA: Insufficient documentation

## 2016-04-14 DIAGNOSIS — Z975 Presence of (intrauterine) contraceptive device: Secondary | ICD-10-CM

## 2016-04-14 LAB — URINALYSIS, ROUTINE W REFLEX MICROSCOPIC
Bilirubin Urine: NEGATIVE
GLUCOSE, UA: NEGATIVE mg/dL
HGB URINE DIPSTICK: NEGATIVE
KETONES UR: 15 mg/dL — AB
LEUKOCYTES UA: NEGATIVE
Nitrite: NEGATIVE
PROTEIN: NEGATIVE mg/dL
Specific Gravity, Urine: 1.025 (ref 1.005–1.030)
pH: 6 (ref 5.0–8.0)

## 2016-04-14 LAB — POCT PREGNANCY, URINE: PREG TEST UR: NEGATIVE

## 2016-04-14 MED ORDER — NORGESTIMATE-ETH ESTRADIOL 0.25-35 MG-MCG PO TABS
1.0000 | ORAL_TABLET | Freq: Every day | ORAL | 1 refills | Status: DC
Start: 2016-04-14 — End: 2016-05-14

## 2016-04-14 NOTE — Discharge Instructions (Signed)
Incision Care, Adult °An incision is a surgical cut that is made through your skin. Most incisions are closed after surgery. Your incision may be closed with stitches (sutures), staples, skin glue, or adhesive strips. You may need to return to your health care provider to have sutures or staples removed. This may occur several days to several weeks after your surgery. The incision needs to be cared for properly to prevent infection. °How to care for your incision °Incision care  °· Follow instructions from your health care provider about how to take care of your incision. Make sure you: °¨ Wash your hands with soap and water before you change the bandage (dressing). If soap and water are not available, use hand sanitizer. °¨ Change your dressing as told by your health care provider. °¨ Leave sutures, skin glue, or adhesive strips in place. These skin closures may need to stay in place for 2 weeks or longer. If adhesive strip edges start to loosen and curl up, you may trim the loose edges. Do not remove adhesive strips completely unless your health care provider tells you to do that. °· Check your incision area every day for signs of infection. Check for: °¨ More redness, swelling, or pain. °¨ More fluid or blood. °¨ Warmth. °¨ Pus or a bad smell. °· Ask your health care provider how to clean the incision. This may include: °¨ Using mild soap and water. °¨ Using a clean towel to pat the incision dry after cleaning it. °¨ Applying a cream or ointment. Do this only as told by your health care provider. °¨ Covering the incision with a clean dressing. °· Ask your health care provider when you can leave the incision uncovered. °· Do not take baths, swim, or use a hot tub until your health care provider approves. Ask your health care provider if you can take showers. You may only be allowed to take sponge baths for bathing. °Medicines °· If you were prescribed an antibiotic medicine, cream, or ointment, take or apply the  antibiotic as told by your health care provider. Do not stop taking or applying the antibiotic even if your condition improves. °· Take over-the-counter and prescription medicines only as told by your health care provider. °General instructions °· Limit movement around your incision to improve healing. °¨ Avoid straining, lifting, or exercise for the first month, or for as long as told by your health care provider. °¨ Follow instructions from your health care provider about returning to your normal activities. °¨ Ask your health care provider what activities are safe. °· Protect your incision from the sun when you are outside for the first 6 months, or for as long as told by your health care provider. Apply sunscreen around the scar or cover it up. °· Keep all follow-up visits as told by your health care provider. This is important. °Contact a health care provider if: °· Your have more redness, swelling, or pain around the incision. °· You have more fluid or blood coming from the incision. °· Your incision feels warm to the touch. °· You have pus or a bad smell coming from the incision. °· You have a fever or shaking chills. °· You are nauseous or you vomit. °· You are dizzy. °· Your sutures or staples come undone. °Get help right away if: °· You have a red streak coming from your incision. °· Your incision bleeds through the dressing and the bleeding does not stop with gentle pressure. °· The edges of   your incision open up and separate.  You have severe pain.  You have a rash.  You are confused.  You faint.  You have trouble breathing and a fast heartbeat. This information is not intended to replace advice given to you by your health care provider. Make sure you discuss any questions you have with your health care provider. Document Released: 11/21/2004 Document Revised: 01/10/2016 Document Reviewed: 11/20/2015 Elsevier Interactive Patient Education  2017 [employer] Oral Contraception Use Oral  contraceptive pills (OCPs) are medicines taken to prevent pregnancy. OCPs work by preventing the ovaries from releasing eggs. The hormones in OCPs also cause the cervical mucus to thicken, preventing the sperm from entering the uterus. The hormones also cause the uterine lining to become thin, not allowing a fertilized egg to attach to the inside of the uterus. OCPs are highly effective when taken exactly as prescribed. However, OCPs do not prevent sexually transmitted diseases (STDs). Safe sex practices, such as using condoms along with an OCP, can help prevent STDs. Before taking OCPs, you may have a physical exam and Pap test. Your health care provider may also order blood tests if necessary. Your health care provider will make sure you are a good candidate for oral contraception. Discuss with your health care provider the possible side effects of the OCP you may be prescribed. When starting an OCP, it can take 2 to 3 months for the body to adjust to the changes in hormone levels in your body. How to take oral contraceptive pills Your health care provider may advise you on how to start taking the first cycle of OCPs. Otherwise, you can:  Start on day 1 of your menstrual period. You will not need any backup contraceptive protection with this start time.  Start on the first Sunday after your menstrual period or the day you get your prescription. In these cases, you will need to use backup contraceptive protection for the first week.  Start the pill at any time of your cycle. If you take the pill within 5 days of the start of your period, you are protected against pregnancy right away. In this case, you will not need a backup form of birth control. If you start at any other time of your menstrual cycle, you will need to use another form of birth control for 7 days. If your OCP is the type called a minipill, it will protect you from pregnancy after taking it for 2 days (48 hours). After you have started  taking OCPs:  If you forget to take 1 pill, take it as soon as you remember. Take the next pill at the regular time.  If you miss 2 or more pills, call your health care provider because different pills have different instructions for missed doses. Use backup birth control until your next menstrual period starts.  If you use a 28-day pack that contains inactive pills and you miss 1 of the last 7 pills (pills with no hormones), it will not matter. Throw away the rest of the non-hormone pills and start a new pill pack. No matter which day you start the OCP, you will always start a new pack on that same day of the week. Have an extra pack of OCPs and a backup contraceptive method available in case you miss some pills or lose your OCP pack. Follow these instructions at home:  Do not smoke.  Always use a condom to protect against STDs. OCPs do not protect against STDs.  Use  a calendar to mark your menstrual period days.  Read the information and directions that came with your OCP. Talk to your health care provider if you have questions. Contact a health care provider if:  You develop nausea and vomiting.  You have abnormal vaginal discharge or bleeding.  You develop a rash.  You miss your menstrual period.  You are losing your hair.  You need treatment for mood swings or depression.  You get dizzy when taking the OCP.  You develop acne from taking the OCP.  You become pregnant. Get help right away if:  You develop chest pain.  You develop shortness of breath.  You have an uncontrolled or severe headache.  You develop numbness or slurred speech.  You develop visual problems.  You develop pain, redness, and swelling in the legs. This information is not intended to replace advice given to you by your health care provider. Make sure you discuss any questions you have with your health care provider. Document Released: 04/23/2011 Document Revised: 10/10/2015 Document Reviewed:  10/23/2012 Elsevier Interactive Patient Education  2017 [employer]

## 2016-04-14 NOTE — MAU Provider Note (Signed)
Chief Complaint:  Arm Pain   First Provider Initiated Contact with Patient 04/14/16 1740     HPI: Carrie Massey is a 30 y.o. [id number] who presents to maternity admissions reporting severe arm pain with Nexplanon. Has had it for 3 years.  Has had no problems.  A few days ago started hurting severely and cannot use left arm much at all now.  No known injury.  Wants it removed She reports vaginal bleeding, vaginal itching/burning, urinary symptoms, h/a, dizziness, n/v, or fever/chills.    Other  This is a new problem. The current episode started in the past 7 days. The problem occurs constantly. The problem has been gradually worsening. Pertinent negatives include no abdominal pain, chills, congestion, fever, nausea or vomiting. Exacerbated by: movement of arm. She has tried ice for the symptoms.   RN Note: nexplanon is to come out in 2 wks.  Last couple days has started having pain in her arm. Can feel it poking.  Has never had problems with it before.  She  Does a lot of lifting with her job, pain is making it hard to work.    Electronically signed by Job FoundsJolynn K Spurlock-Frizzell, RN at 4911    Past Medical History: Past Medical History:  Diagnosis Date  . Abnormal Pap smear    repeat WNL  . Depression   . Hypertension    Not during pregnancy - not taking meds  . Hyperthyroidism    Prior to pregnancy - no meds  . Mental disorder    Depression  . Postpartum depression     Past obstetric history: OB History  Gravida Para Term Preterm AB Living  3 2 2   1 2   SAB TAB Ectopic Multiple Live Births  1       2    # Outcome Date GA Lbr Len/2nd Weight Sex Delivery Anes PTL Lv  3 Term 03/24/13 7859w5d 96:51 / 02:24 6 lb 13.3 oz (3.099 kg) F VBAC EPI  LIV  2 Term 03/15/09 123w2d  10 lb 1 oz (4.564 kg) F CS-LTranv Spinal  LIV     Birth Comments: macrosomia  1 SAB               Past Surgical History: Past Surgical History:  Procedure Laterality Date  . CESAREAN SECTION      Family  History: Family History  Problem Relation Age of Onset  . Diabetes Father   . Diabetes Paternal Aunt   . Diabetes Paternal Grandmother   . Anesthesia problems Neg Hx     Social History: Social History  Substance Use Topics  . Smoking status: Never Smoker  . Smokeless tobacco: Never Used  . Alcohol use No    Allergies:  Allergies  Allergen Reactions  . Penicillins Hives  . Percocet [Oxycodone-Acetaminophen] Hives    Meds:  Prescriptions Prior to Admission  Medication Sig Dispense Refill Last Dose  . tinidazole (TINDAMAX) 500 MG tablet Take 2 tablets (1,000 mg total) by mouth daily with breakfast. 10 tablet 2     I have reviewed patient's Past Medical Hx, Surgical Hx, Family Hx, Social Hx, medications and allergies.  ROS:  Review of Systems  Constitutional: Negative for chills and fever.  HENT: Negative for congestion.   Gastrointestinal: Negative for abdominal pain, nausea and vomiting.   Other systems negative     Physical Exam  Patient Vitals for the past 24 hrs:  BP Temp Temp src Pulse Resp Weight  04/14/16 1612 105/85  98.2 F (36.8 C) Oral 73 16 242 lb 8 oz (110 kg)   Constitutional: Well-developed, well-nourished female in no acute distress.  Cardiovascular: normal rate and rhythm Respiratory: normal effort, no distress.  GI: Abd soft, non-tender.  Nondistended.  No rebound, No guarding.  Bowel Sounds audible  MS: Extremities nontender, no edema, normal ROM Neurologic: Alert and oriented x 4.   Grossly nonfocal. GU: Neg CVAT. Skin:  Warm and Dry Psych:  Affect appropriate.  PELVIC EXAM: deferred   Labs: Results for orders placed or performed during the hospital encounter of 04/14/16 (from the past 24 hour(s))  Urinalysis, Routine w reflex microscopic (not at [facility])     Status: Abnormal   Collection Time: 04/14/16  4:25 PM  Result Value Ref Range   Color, Urine YELLOW YELLOW   APPearance CLEAR CLEAR   Specific Gravity, Urine 1.025 1.005 - 1.030    pH 6.0 5.0 - 8.0   Glucose, UA NEGATIVE NEGATIVE mg/dL   Hgb urine dipstick NEGATIVE NEGATIVE   Bilirubin Urine NEGATIVE NEGATIVE   Ketones, ur 15 (A) NEGATIVE mg/dL   Protein, ur NEGATIVE NEGATIVE mg/dL   Nitrite NEGATIVE NEGATIVE   Leukocytes, UA NEGATIVE NEGATIVE  Pregnancy, urine POC     Status: None   Collection Time: 04/14/16  4:38 PM  Result Value Ref Range   Preg Test, Ur NEGATIVE NEGATIVE      Imaging:  No results found.  MAU Course/MDM: I have ordered labs as follows:  UA, UPT Imaging ordered: none Results reviewed.   Consult Dr Debroah LoopArnold.  Discussed we usually do not remove these in the ER setting, but after Dr Debroah LoopArnold examined patient and noted her extreme pain, he elected to remove it.   Treatments in MAU included Removal of Nexplanon from left arm  Consent obtained and Time-Out conducted Implant palpated in left upper arm Betadine prep done on area of excision/removal Lidocaine infiltrated into intradermal and subcutaneous space Small 2mm incision made with scalpel Pressure applied to distal end of implant which exposed the tip through incision Tip of implant grasped with hemostat There was some adherence of implant in subcutaneous tissue.  Twisting and manipulation freed the implant from the capsule Implant removed Pressure held on incision until bleeding stopped Pressure dressing applied by RN Patient tolerated procedure well.   .   Pt stable at time of discharge.  Assessment: Pain with Nexplanon implant Removal of Nexplanon implant  Plan: Discharge home Recommend Pressure to wound for 2 hours, keep clean and dry Rx sent for Sprintec OCPs  For interim contraception with one month refill. [facility]   Encouraged to return here or to other Urgent Care/ED if she develops worsening of symptoms, increase in pain, fever, or other concerning symptoms.   Wynelle BourgeoisMarie Akilah Cureton CNM, MSN Certified Nurse-Midwife 04/14/2016 5:41 PM

## 2016-04-14 NOTE — MAU Note (Signed)
nexplanon is to come out in 2 wks.  Last couple days has started having pain in her arm. Can feel it poking.  Has never had problems with it before.  She  Does a lot of lifting with her job, pain is making it hard to work.

## 2016-04-14 NOTE — MAU Note (Signed)
Dressing dry and intact. Patient states she feels much better. Able to move arm without pain.

## 2016-04-28 ENCOUNTER — Ambulatory Visit: Payer: Self-pay | Admitting: Obstetrics

## 2016-05-14 ENCOUNTER — Ambulatory Visit (INDEPENDENT_AMBULATORY_CARE_PROVIDER_SITE_OTHER): Payer: BC Managed Care – PPO | Admitting: Obstetrics

## 2016-05-14 ENCOUNTER — Encounter: Payer: Self-pay | Admitting: Obstetrics

## 2016-05-14 DIAGNOSIS — Z01419 Encounter for gynecological examination (general) (routine) without abnormal findings: Secondary | ICD-10-CM | POA: Diagnosis not present

## 2016-05-14 DIAGNOSIS — Z124 Encounter for screening for malignant neoplasm of cervix: Secondary | ICD-10-CM

## 2016-05-14 DIAGNOSIS — Z3009 Encounter for other general counseling and advice on contraception: Secondary | ICD-10-CM

## 2016-05-14 DIAGNOSIS — Z0000 Encounter for general adult medical examination without abnormal findings: Secondary | ICD-10-CM

## 2016-05-14 DIAGNOSIS — Z1151 Encounter for screening for human papillomavirus (HPV): Secondary | ICD-10-CM | POA: Diagnosis not present

## 2016-05-14 MED ORDER — NORGESTIMATE-ETH ESTRADIOL 0.25-35 MG-MCG PO TABS: 1.0000 | ORAL_TABLET | Freq: Every day | ORAL | 11 refills | Status: DC

## 2016-05-14 NOTE — Progress Notes (Signed)
Subjective:        Carrie Massey is a 30 y.o. female here for a routine exam.  Current complaints: None.    Personal health questionnaire:  Is patient Ashkenazi Jewish, have a family history of breast and/or ovarian cancer: no Is there a family history of uterine cancer diagnosed at age < 1550, gastrointestinal cancer, urinary tract cancer, family member who is a Personnel officerLynch syndrome-associated carrier: no Is the patient overweight and hypertensive, family history of diabetes, personal history of gestational diabetes, preeclampsia or PCOS: no Is patient over 2755, have PCOS,  family history of premature CHD under age 28, diabetes, smoke, have hypertension or peripheral artery disease:  no At any time, has a partner hit, kicked or otherwise hurt or frightened you?: no Over the past 2 weeks, have you felt down, depressed or hopeless?: no Over the past 2 weeks, have you felt little interest or pleasure in doing things?:no   Gynecologic History Patient's last menstrual period was 06/01/2013 (approximate). Contraception: OCP (estrogen/progesterone) Last Pap: 2016. Results were: normal Last mammogram: n/a. Results were: n/a  Obstetric History OB History  Gravida Para Term Preterm AB Living  3 2 2   1 2   SAB TAB Ectopic Multiple Live Births  1       2    # Outcome Date GA Lbr Len/2nd Weight Sex Delivery Anes PTL Lv  3 Term 03/24/13 5213w5d 96:51 / 02:24 6 lb 13.3 oz (3.099 kg) F VBAC EPI  LIV  2 Term 03/15/09 6832w2d  10 lb 1 oz (4.564 kg) F CS-LTranv Spinal  LIV     Birth Comments: macrosomia  1 SAB               Past Medical History:  Diagnosis Date  . Abnormal Pap smear    repeat WNL  . Depression   . Hypertension    Not during pregnancy - not taking meds  . Hyperthyroidism    Prior to pregnancy - no meds  . Mental disorder    Depression  . Postpartum depression     Past Surgical History:  Procedure Laterality Date  . CESAREAN SECTION       Current Outpatient  Prescriptions:  .  acetaminophen (TYLENOL) 500 MG tablet, Take 1,000 mg by mouth every 6 (six) hours as needed for mild pain, moderate pain or headache., Disp: , Rfl:  .  ibuprofen (ADVIL,MOTRIN) 200 MG tablet, Take 600 mg by mouth every 6 (six) hours as needed for headache, mild pain or moderate pain., Disp: , Rfl:  .  norgestimate-ethinyl estradiol (ORTHO-CYCLEN,SPRINTEC,PREVIFEM) 0.25-35 MG-MCG tablet, Take 1 tablet by mouth daily., Disp: 1 Package, Rfl: 11 .  etonogestrel (NEXPLANON) 68 MG IMPL implant, 1 each by Subdermal route once., Disp: , Rfl:  Allergies  Allergen Reactions  . Penicillins Hives and Other (See Comments)    Has patient had a PCN reaction causing immediate rash, facial/tongue/throat swelling, SOB or lightheadedness with hypotension: No Has patient had a PCN reaction causing severe rash involving mucus membranes or skin necrosis: No Has patient had a PCN reaction that required hospitalization No Has patient had a PCN reaction occurring within the last 10 years: No If all of the above answers are "NO", then may proceed with Cephalosporin use.  Marland Kitchen. Percocet [Oxycodone-Acetaminophen] Hives    Social History  Substance Use Topics  . Smoking status: Never Smoker  . Smokeless tobacco: Never Used  . Alcohol use No    Family History  Problem Relation  Age of Onset  . Diabetes Father   . Diabetes Paternal Aunt   . Diabetes Paternal Grandmother   . Anesthesia problems Neg Hx       Review of Systems  Constitutional: negative for fatigue and weight loss Respiratory: negative for cough and wheezing Cardiovascular: negative for chest pain, fatigue and palpitations Gastrointestinal: negative for abdominal pain and change in bowel habits Musculoskeletal:negative for myalgias Neurological: negative for gait problems and tremors Behavioral/Psych: negative for abusive relationship, depression Endocrine: negative for temperature intolerance    Genitourinary:negative for  abnormal menstrual periods, genital lesions, hot flashes, sexual problems and vaginal discharge Integument/breast: negative for breast lump, breast tenderness, nipple discharge and skin lesion(s)    Objective:       BP 121/77   Pulse 89   Wt 247 lb 3.2 oz (112.1 kg)   LMP 06/01/2013 (Approximate)   BMI 44.49 kg/m  General:   alert  Skin:   no rash or abnormalities  Lungs:   clear to auscultation bilaterally  Heart:   regular rate and rhythm, S1, S2 normal, no murmur, click, rub or gallop  Breasts:   normal without suspicious masses, skin or nipple changes or axillary nodes  Abdomen:  normal findings: no organomegaly, soft, non-tender and no hernia  Pelvis:  External genitalia: normal general appearance Urinary system: urethral meatus normal and bladder without fullness, nontender Vaginal: normal without tenderness, induration or masses Cervix: normal appearance Adnexa: normal bimanual exam Uterus: anteverted and non-tender, normal size   Lab Review Urine pregnancy test Labs reviewed yes Radiologic studies reviewed no  50% of 20 min visit spent on counseling and coordination of care.    Assessment:    Healthy female exam.    Contraceptive Counseling and Advice.  Wants OCP's.   Plan:    Ortho Cyclen Rx  Education reviewed: calcium supplements, low fat, low cholesterol diet, safe sex/STD prevention, self breast exams and weight bearing exercise. Contraception: OCP (estrogen/progesterone). Follow up in: 1 year.   Meds ordered this encounter  Medications  . norgestimate-ethinyl estradiol (ORTHO-CYCLEN,SPRINTEC,PREVIFEM) 0.25-35 MG-MCG tablet    Sig: Take 1 tablet by mouth daily.    Dispense:  1 Package    Refill:  11   Orders Placed This Encounter  Procedures  . NuSwab Vaginitis Plus (VG+)     Patient ID: Carrie Massey, female   DOB: 01-Dec-1985, 30 y.o.   MRN: [id number] Patient ID: Carrie Massey, female   DOB: 01-Dec-1985, 30 y.o.   MRN: [id number]

## 2016-05-17 LAB — NUSWAB VAGINITIS PLUS (VG+)
CANDIDA GLABRATA, NAA: NEGATIVE
CHLAMYDIA TRACHOMATIS, NAA: NEGATIVE
Candida albicans, NAA: NEGATIVE
Neisseria gonorrhoeae, NAA: NEGATIVE
TRICH VAG BY NAA: NEGATIVE

## 2016-05-19 LAB — CYTOLOGY - PAP
DIAGNOSIS: NEGATIVE
HPV (WINDOPATH): NOT DETECTED

## 2017-08-09 ENCOUNTER — Encounter: Payer: Self-pay | Admitting: *Deleted

## 2017-11-25 ENCOUNTER — Encounter (HOSPITAL_COMMUNITY): Payer: Self-pay | Admitting: Emergency Medicine

## 2017-11-25 ENCOUNTER — Ambulatory Visit (HOSPITAL_COMMUNITY)
Admission: EM | Admit: 2017-11-25 | Discharge: 2017-11-25 | Disposition: A | Discharge status: Home / Self Care | Payer: BC Managed Care – PPO

## 2017-11-25 DIAGNOSIS — M542 Cervicalgia: Secondary | ICD-10-CM | POA: Diagnosis not present

## 2017-11-25 DIAGNOSIS — M25511 Pain in right shoulder: Secondary | ICD-10-CM

## 2017-11-25 DIAGNOSIS — M79601 Pain in right arm: Secondary | ICD-10-CM | POA: Diagnosis not present

## 2017-11-25 MED ORDER — IBUPROFEN 800 MG PO TABS
800.0000 mg | ORAL_TABLET | Freq: Once | ORAL | Status: AC
  Administered 2017-11-25: 800 mg via ORAL

## 2017-11-25 MED ORDER — NAPROXEN 500 MG PO TABS: 500.0000 mg | ORAL_TABLET | Freq: Two times a day (BID) | ORAL | 0 refills | Status: AC

## 2017-11-25 MED ORDER — IBUPROFEN 800 MG PO TABS
ORAL_TABLET | ORAL | Status: AC
  Filled 2017-11-25: qty 1

## 2017-11-25 NOTE — Discharge Instructions (Addendum)
It was nice meeting you!!  No alarming signs on your exam. We will give you some ibuprofen here and a prescription for naproxen.  The soreness may last a week or more. For any worsening symptoms or problems please return.
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
## 2018-04-26 ENCOUNTER — Encounter: Payer: Self-pay | Admitting: Obstetrics

## 2018-04-26 ENCOUNTER — Ambulatory Visit (INDEPENDENT_AMBULATORY_CARE_PROVIDER_SITE_OTHER): Payer: BC Managed Care – PPO | Admitting: Obstetrics

## 2018-04-26 VITALS — BP 118/87 | HR 78 | Wt 257.4 lb

## 2018-04-26 DIAGNOSIS — Z30017 Encounter for initial prescription of implantable subdermal contraceptive: Secondary | ICD-10-CM

## 2018-04-26 DIAGNOSIS — N898 Other specified noninflammatory disorders of vagina: Secondary | ICD-10-CM | POA: Diagnosis not present

## 2018-04-26 DIAGNOSIS — N760 Acute vaginitis: Secondary | ICD-10-CM | POA: Diagnosis not present

## 2018-04-26 DIAGNOSIS — B9689 Other specified bacterial agents as the cause of diseases classified elsewhere: Secondary | ICD-10-CM

## 2018-04-26 DIAGNOSIS — Z3049 Encounter for surveillance of other contraceptives: Secondary | ICD-10-CM

## 2018-04-26 DIAGNOSIS — Z3202 Encounter for pregnancy test, result negative: Secondary | ICD-10-CM | POA: Diagnosis not present

## 2018-04-26 DIAGNOSIS — B373 Candidiasis of vulva and vagina: Secondary | ICD-10-CM | POA: Diagnosis not present

## 2018-04-26 LAB — POCT URINE PREGNANCY: PREG TEST UR: NEGATIVE

## 2018-04-26 NOTE — Progress Notes (Signed)
Pt is here for nexplanon insertion. LMP 04/04/18. Pt had unprotected intercourse on Friday, cannot do the nexplanon today.

## 2018-04-26 NOTE — Progress Notes (Signed)
Subjective:    Carrie Massey is a 32 y.o. female who presents for Nexplanon Insertion. The patient admits to unprotected intercourse 4 days ago. The patient is sexually active. Pertinent past medical history: none.  The information documented in the HPI was reviewed and verified.  Menstrual History: OB History    Gravida  3   Para  2   Term  2   Preterm      AB  1   Living  2     SAB  1   TAB      Ectopic      Multiple      Live Births  2            Patient's last menstrual period was 04/04/2018 (exact date).   Patient Active Problem List   Diagnosis Date Noted  . Postpartum depression 05/15/2013  . Unspecified high-risk pregnancy 03/09/2013  . Large for dates affecting management of mother 01/19/2013  . Obesity 01/06/2013  . Depressive disorder, not elsewhere classified 10/12/2012   Past Medical History:  Diagnosis Date  . Abnormal Pap smear    repeat WNL  . Depression   . Hypertension    Not during pregnancy - not taking meds  . Hyperthyroidism    Prior to pregnancy - no meds  . Mental disorder    Depression  . Postpartum depression     Past Surgical History:  Procedure Laterality Date  . CESAREAN SECTION       Current Outpatient Medications:  .  Escitalopram Oxalate (LEXAPRO PO), Take by mouth., Disp: , Rfl:  .  ibuprofen (ADVIL,MOTRIN) 200 MG tablet, Take 600 mg by mouth every 6 (six) hours as needed for headache, mild pain or moderate pain., Disp: , Rfl:  .  acetaminophen (TYLENOL) 500 MG tablet, Take 1,000 mg by mouth every 6 (six) hours as needed for mild pain, moderate pain or headache., Disp: , Rfl:  .  naproxen (NAPROSYN) 500 MG tablet, Take 1 tablet (500 mg total) by mouth 2 (two) times daily. (Patient not taking: Reported on 04/26/2018), Disp: 30 tablet, Rfl: 0 Allergies  Allergen Reactions  . Penicillins Hives and Other (See Comments)    Has patient had a PCN reaction causing immediate rash, facial/tongue/throat swelling, SOB or  lightheadedness with hypotension: No Has patient had a PCN reaction causing severe rash involving mucus membranes or skin necrosis: No Has patient had a PCN reaction that required hospitalization No Has patient had a PCN reaction occurring within the last 10 years: No If all of the above answers are "NO", then may proceed with Cephalosporin use.  Marland Kitchen. Percocet [Oxycodone-Acetaminophen] Hives    Social History   Tobacco Use  . Smoking status: Never Smoker  . Smokeless tobacco: Never Used  Substance Use Topics  . Alcohol use: No    Family History  Problem Relation Age of Onset  . Diabetes Father   . Diabetes Paternal Aunt   . Diabetes Paternal Grandmother   . Anesthesia problems Neg Hx        Review of Systems Constitutional: negative for weight loss Genitourinary:negative for abnormal menstrual periods and vaginal discharge   Objective:   BP 118/87   Pulse 78   Wt 257 lb 6.4 oz (116.8 kg)   LMP 04/04/2018 (Exact Date)   BMI 46.33 kg/m    General:   alert  Skin:   no rash or abnormalities  Lungs:   clear to auscultation bilaterally  Heart:   regular  rate and rhythm, S1, S2 normal, no murmur, click, rub or gallop  Breasts:   normal without suspicious masses, skin or nipple changes or axillary nodes  Abdomen:  normal findings: no organomegaly, soft, non-tender and no hernia  Pelvis:  External genitalia: normal general appearance Urinary system: urethral meatus normal and bladder without fullness, nontender Vaginal: normal without tenderness, induration or masses Cervix: normal appearance Adnexa: normal bimanual exam Uterus: anteverted and non-tender, normal size   Lab Review Urine pregnancy test Labs reviewed yes Radiologic studies reviewed no  50% of 15 min visit spent on counseling and coordination of care.    Assessment:    32 y.o., starting Nexplanon in 2 weeks with abstinence and negative UPT in 2 weeks.  Plan:   1. Encounter for initial prescription of  Nexplanon Rx: - POCT urine pregnancy:  NEGATIVE  2. Vaginal discharge Rx: - Cervicovaginal ancillary only    All questions answered. Chlamydia specimen. Contraception: IUD. Follow up in 2 weeks. GC specimen. Pregnancy test, result: negative. Wet prep.    No orders of the defined types were placed in this encounter.  Orders Placed This Encounter  Procedures  . POCT urine pregnancy    Brock Bad MD 04-26-2018

## 2018-04-28 ENCOUNTER — Other Ambulatory Visit: Payer: Self-pay | Admitting: Obstetrics

## 2018-04-28 DIAGNOSIS — N760 Acute vaginitis: Secondary | ICD-10-CM

## 2018-04-28 DIAGNOSIS — B373 Candidiasis of vulva and vagina: Secondary | ICD-10-CM

## 2018-04-28 DIAGNOSIS — B3731 Acute candidiasis of vulva and vagina: Secondary | ICD-10-CM

## 2018-04-28 DIAGNOSIS — B9689 Other specified bacterial agents as the cause of diseases classified elsewhere: Secondary | ICD-10-CM

## 2018-04-28 LAB — CERVICOVAGINAL ANCILLARY ONLY
Bacterial vaginitis: POSITIVE — AB
CANDIDA VAGINITIS: POSITIVE — AB

## 2018-04-28 MED ORDER — FLUCONAZOLE 150 MG PO TABS: 150.0000 mg | ORAL_TABLET | Freq: Once | ORAL | 2 refills | Status: AC

## 2018-04-28 MED ORDER — TINIDAZOLE 500 MG PO TABS: 1000.0000 mg | ORAL_TABLET | Freq: Every day | ORAL | 2 refills | Status: AC

## 2018-04-29 ENCOUNTER — Telehealth: Payer: Self-pay

## 2018-04-29 NOTE — Telephone Encounter (Signed)
Attempted to contact about results and rx sent, no answer, left vm. 

## 2018-04-29 NOTE — Telephone Encounter (Signed)
Patient returned call, advised of results and rx sent.

## 2018-05-09 ENCOUNTER — Ambulatory Visit (INDEPENDENT_AMBULATORY_CARE_PROVIDER_SITE_OTHER): Payer: BC Managed Care – PPO | Admitting: Obstetrics

## 2018-05-09 ENCOUNTER — Encounter: Payer: Self-pay | Admitting: Obstetrics

## 2018-05-09 VITALS — BP 134/94 | HR 85 | Wt 256.0 lb

## 2018-05-09 DIAGNOSIS — Z3202 Encounter for pregnancy test, result negative: Secondary | ICD-10-CM

## 2018-05-09 DIAGNOSIS — Z30017 Encounter for initial prescription of implantable subdermal contraceptive: Secondary | ICD-10-CM

## 2018-05-09 LAB — POCT URINE PREGNANCY: PREG TEST UR: NEGATIVE

## 2018-05-09 MED ORDER — ETONOGESTREL 68 MG ~~LOC~~ IMPL
68.0000 mg | DRUG_IMPLANT | Freq: Once | SUBCUTANEOUS | Status: AC
  Administered 2018-05-09: 68 mg via SUBCUTANEOUS

## 2018-05-09 NOTE — Progress Notes (Signed)
RGYN patient here for Nexplanon Insertion LMP: 05/04/18 Denies any unprotected intercourse x 14 days.  UPT: NEGATIVE

## 2018-05-09 NOTE — Progress Notes (Signed)
Nexplanon Procedure Note   PRE-OP DIAGNOSIS: desired long-term, reversible contraception ( LARC ) POST-OP DIAGNOSIS: Same  PROCEDURE: Nexplanon  placement Performing Provider: Brock BadHARLES A. HARPER MD  Patient education prior to procedure, explained risk, benefits of Nexplanon, reviewed alternative options. Patient reported understanding. Gave consent to continue with procedure.   PROCEDURE:  Pregnancy Text :  Negative Site (check):      left arm         Sterile Preparation:   Betadinex3 Lot # [id number] Expiration Date 2022 MAR 05  Insertion site was selected 8 - 10 cm from medial epicondyle and marked along with guiding site using sterile marker. Procedure area was prepped and draped in a sterile fashion. 1% Lidocaine 1.5 ml given prior to procedure. Nexplanon  was inserted subcutaneously.Needle was removed from the insertion site. Nexplanon capsule was palpated by provider and patient to assure satisfactory placement. Dressing applied.  Followup: The patient tolerated the procedure well without complications.  Standard post-procedure care is explained and return precautions are given.   Brock BadHARLES A. HARPER MD 05-09-2018

## 2018-05-24 ENCOUNTER — Ambulatory Visit (INDEPENDENT_AMBULATORY_CARE_PROVIDER_SITE_OTHER): Payer: BC Managed Care – PPO | Admitting: Obstetrics

## 2018-05-24 ENCOUNTER — Encounter: Payer: Self-pay | Admitting: Obstetrics

## 2018-05-24 ENCOUNTER — Ambulatory Visit: Payer: Self-pay | Admitting: Obstetrics

## 2018-05-24 VITALS — BP 134/79 | HR 85 | Wt 252.4 lb

## 2018-05-24 DIAGNOSIS — Z3046 Encounter for surveillance of implantable subdermal contraceptive: Secondary | ICD-10-CM | POA: Diagnosis not present

## 2018-05-24 DIAGNOSIS — Z6841 Body Mass Index (BMI) 40.0 and over, adult: Secondary | ICD-10-CM

## 2018-05-24 NOTE — Progress Notes (Signed)
Pt is here for nexplanon follow up. Nexplanon inserted 05/09/18.  No concerns.

## 2018-05-24 NOTE — Progress Notes (Signed)
Subjective:    Carrie Massey is a 33 y.o. female who presents for follow up after Nexplanon Insertion. The patient has no complaints today. The patient is sexually active. Pertinent past medical history: none.  The information documented in the HPI was reviewed and verified.  Menstrual History: OB History    Gravida  3   Para  2   Term  2   Preterm      AB  1   Living  2     SAB  1   TAB      Ectopic      Multiple      Live Births  2            Patient's last menstrual period was 05/04/2018 (exact date).   Patient Active Problem List   Diagnosis Date Noted  . Postpartum depression 05/15/2013  . Unspecified high-risk pregnancy 03/09/2013  . Large for dates affecting management of mother 01/19/2013  . Obesity 01/06/2013  . Depressive disorder, not elsewhere classified 10/12/2012   Past Medical History:  Diagnosis Date  . Abnormal Pap smear    repeat WNL  . Depression   . Hypertension    Not during pregnancy - not taking meds  . Hyperthyroidism    Prior to pregnancy - no meds  . Mental disorder    Depression  . Postpartum depression     Past Surgical History:  Procedure Laterality Date  . CESAREAN SECTION       Current Outpatient Medications:  .  acetaminophen (TYLENOL) 500 MG tablet, Take 1,000 mg by mouth every 6 (six) hours as needed for mild pain, moderate pain or headache., Disp: , Rfl:  .  Escitalopram Oxalate (LEXAPRO PO), Take by mouth., Disp: , Rfl:  .  ibuprofen (ADVIL,MOTRIN) 200 MG tablet, Take 600 mg by mouth every 6 (six) hours as needed for headache, mild pain or moderate pain., Disp: , Rfl:  .  naproxen (NAPROSYN) 500 MG tablet, Take 1 tablet (500 mg total) by mouth 2 (two) times daily. (Patient not taking: Reported on 04/26/2018), Disp: 30 tablet, Rfl: 0 .  tinidazole (TINDAMAX) 500 MG tablet, Take 2 tablets (1,000 mg total) by mouth daily with breakfast. (Patient not taking: Reported on 05/24/2018), Disp: 10 tablet, Rfl:  2 Allergies  Allergen Reactions  . Penicillins Hives and Other (See Comments)    Has patient had a PCN reaction causing immediate rash, facial/tongue/throat swelling, SOB or lightheadedness with hypotension: No Has patient had a PCN reaction causing severe rash involving mucus membranes or skin necrosis: No Has patient had a PCN reaction that required hospitalization No Has patient had a PCN reaction occurring within the last 10 years: No If all of the above answers are "NO", then may proceed with Cephalosporin use.  Marland Kitchen. Percocet [Oxycodone-Acetaminophen] Hives    Social History   Tobacco Use  . Smoking status: Never Smoker  . Smokeless tobacco: Never Used  Substance Use Topics  . Alcohol use: No    Family History  Problem Relation Age of Onset  . Diabetes Father   . Diabetes Paternal Aunt   . Diabetes Paternal Grandmother   . Anesthesia problems Neg Hx        Review of Systems Constitutional: negative for weight loss Genitourinary:negative for abnormal menstrual periods and vaginal discharge   Objective:   BP 134/79   Pulse 85   Wt 252 lb 6.4 oz (114.5 kg)   LMP 05/04/2018 (Exact Date)   BMI 45.43  kg/m    General:   alert and no distress  Skin:   no rash or abnormalities  Lungs:   clear to auscultation bilaterally  Heart:   regular rate and rhythm, S1, S2 normal, no murmur, click, rub or gallop               Left Upper Extremity:  Insertion site healed, non tender.  Rod palpated, intact   Lab Review Urine pregnancy test Labs reviewed yes Radiologic studies reviewed no  50% of 15 min visit spent on counseling and coordination of care.    Assessment:    33 y.o., continuing Nexplanon, no contraindications.   Plan:    All questions answered. Contraception: Nexplanon. Discussed healthy lifestyle modifications. Follow up as needed. No orders of the defined types were placed in this encounter.  No orders of the defined types were placed in this  encounter.   Brock Bad MD 05-24-2018

## 2018-09-16 ENCOUNTER — Other Ambulatory Visit: Payer: Self-pay

## 2018-09-16 ENCOUNTER — Encounter (HOSPITAL_COMMUNITY): Payer: Self-pay

## 2018-09-16 ENCOUNTER — Emergency Department (HOSPITAL_COMMUNITY)
Admission: EM | Admit: 2018-09-16 | Discharge: 2018-09-16 | Disposition: A | Discharge status: Home / Self Care | Payer: BC Managed Care – PPO | Attending: Emergency Medicine | Admitting: Emergency Medicine

## 2018-09-16 DIAGNOSIS — Z3046 Encounter for surveillance of implantable subdermal contraceptive: Secondary | ICD-10-CM | POA: Diagnosis not present

## 2018-09-16 DIAGNOSIS — N760 Acute vaginitis: Secondary | ICD-10-CM | POA: Insufficient documentation

## 2018-09-16 DIAGNOSIS — N939 Abnormal uterine and vaginal bleeding, unspecified: Secondary | ICD-10-CM | POA: Diagnosis not present

## 2018-09-16 DIAGNOSIS — Z79899 Other long term (current) drug therapy: Secondary | ICD-10-CM | POA: Diagnosis not present

## 2018-09-16 DIAGNOSIS — B9689 Other specified bacterial agents as the cause of diseases classified elsewhere: Secondary | ICD-10-CM

## 2018-09-16 LAB — CBC
HCT: 43 % (ref 36.0–46.0)
Hemoglobin: 13.7 g/dL (ref 12.0–15.0)
MCH: 27.8 pg (ref 26.0–34.0)
MCHC: 31.9 g/dL (ref 30.0–36.0)
MCV: 87.4 fL (ref 80.0–100.0)
Platelets: 289 10*3/uL (ref 150–400)
RBC: 4.92 MIL/uL (ref 3.87–5.11)
RDW: 13.3 % (ref 11.5–15.5)
WBC: 9.1 10*3/uL (ref 4.0–10.5)
nRBC: 0 % (ref 0.0–0.2)

## 2018-09-16 LAB — BASIC METABOLIC PANEL
Anion gap: 6 (ref 5–15)
BUN: 7 mg/dL (ref 6–20)
CO2: 26 mmol/L (ref 22–32)
Calcium: 8.9 mg/dL (ref 8.9–10.3)
Chloride: 107 mmol/L (ref 98–111)
Creatinine, Ser: 0.9 mg/dL (ref 0.44–1.00)
GFR calc Af Amer: >60 mL/min (ref >60)
GFR calc non Af Amer: >60 mL/min (ref >60)
Glucose, Bld: 93 mg/dL (ref 70–99)
Potassium: 4 mmol/L (ref 3.5–5.1)
Sodium: 139 mmol/L (ref 135–145)

## 2018-09-16 LAB — WET PREP, GENITAL
Sperm: NONE SEEN
Trich, Wet Prep: NONE SEEN
Yeast Wet Prep HPF POC: NONE SEEN

## 2018-09-16 LAB — PREGNANCY, URINE: Preg Test, Ur: NEGATIVE

## 2018-09-16 MED ORDER — LIDOCAINE-EPINEPHRINE (PF) 2 %-1:200000 IJ SOLN
10.0000 mL | Freq: Once | INTRAMUSCULAR | Status: AC
  Administered 2018-09-16: 10 mL
  Filled 2018-09-16: qty 10

## 2018-09-16 MED ORDER — METRONIDAZOLE 500 MG PO TABS: 500.0000 mg | ORAL_TABLET | Freq: Two times a day (BID) | ORAL | 0 refills | Status: AC

## 2018-09-16 NOTE — Discharge Instructions (Addendum)
Your Nexplanon was removed today, please begin taking the birth control that was prescribed by your primary care doctor.  If bleeding continues you will need to follow-up with your PCP/OB/GYN for continued evaluation.  Your labs are very reassuring today.  Your wet prep did show signs of BV, please take Flagyl for the next 7 days as directed, do not drink alcohol while using this medication.  You have gonorrhea and Chlamydia testing pending you will be called if any of these results are positive.  Please return to the emergency department if you begin having significantly heavier bleeding feel lightheaded or like you may pass out, worsening fatigue or any other new or concerning symptoms.

## 2018-09-16 NOTE — ED Triage Notes (Signed)
Patient c/o vaginal bleeding for almost 2 months. Patient also reports that she has an implant in the left upper arm and has been feeling intermittent numbness and throbbing since mid April.

## 2018-09-16 NOTE — ED Provider Notes (Signed)
[facility]-EMERGENCY DEPT Provider Note   CSN: [id number] Arrival date & time: 09/16/18  1217    History   Chief Complaint Chief Complaint  Patient presents with  . Vaginal Bleeding    HPI Carrie Massey is a 33 y.o. female.     Carrie Massey is a 33 y.o. female with a history of obesity, depression and hyperthyroidism, who presents to the emergency department for evaluation of vaginal bleeding.  She reports that for the past 2 months she has had persistent vaginal bleeding it initially started as light but has increased in heaviness and yesterday she passed 1 blood clot.  She reports that typically her menstrual cycle would only last 3 to 4 days, but with Nexplanon implant she rarely had any bleeding at all until recently.  Had the Nexplanon implant placed 6 months ago this was her second implant but she has had much more pain surrounding implant site and is now having persistent bleeding.  She has some occasional lower abdominal cramping associated with bleeding but no persistent abdominal pain.  She is unsure if she has had any associated vaginal discharge, denies urinary symptoms.  She reports she has been sexually active but only with 1 partner.  She reports that she has had increasing pain in her left arm radiating from the Nexplanon implant site and she occasionally has some numbness and tingling in this area.  She did not have similar symptoms with her previous Nexplanon implant.  She reports that she called her OB/GYN regarding the symptoms but could not get in to be seen until the end of the month and was concerned about persistent bleeding and pain.  She saw her PCP today who sent her here for further evaluation since she could not be seen in the OB/GYN clinic to have this removed.  She reports her OB/GYN is planning on starting her on oral OCPs if she can have Nexplanon removed today.     Past Medical History:  Diagnosis Date  . Abnormal Pap smear    repeat WNL  . Depression   . Hyperthyroidism    Prior to pregnancy - no meds  . Mental disorder    Depression  . Postpartum depression     Patient Active Problem List   Diagnosis Date Noted  . Postpartum depression 05/15/2013  . Unspecified high-risk pregnancy 03/09/2013  . Large for dates affecting management of mother 01/19/2013  . Obesity 01/06/2013  . Depressive disorder, not elsewhere classified 10/12/2012    Past Surgical History:  Procedure Laterality Date  . CESAREAN SECTION       OB History    Gravida  3   Para  2   Term  2   Preterm      AB  1   Living  2     SAB  1   TAB      Ectopic      Multiple      Live Births  2            Home Medications    Prior to Admission medications   Medication Sig Start Date End Date Taking? Authorizing Provider  acetaminophen (TYLENOL) 500 MG tablet Take 1,000 mg by mouth every 6 (six) hours as needed for mild pain, moderate pain or headache.    [provider]  Escitalopram Oxalate (LEXAPRO PO) Take by mouth.    [provider]  ibuprofen (ADVIL,MOTRIN) 200 MG tablet Take 600 mg by mouth  every 6 (six) hours as needed for headache, mild pain or moderate pain.    [provider]  naproxen (NAPROSYN) 500 MG tablet Take 1 tablet (500 mg total) by mouth 2 (two) times daily. Patient not taking: Reported on 04/26/2018 11/25/17   Janace Aris, NP  tinidazole (TINDAMAX) 500 MG tablet Take 2 tablets (1,000 mg total) by mouth daily with breakfast. Patient not taking: Reported on 05/24/2018 04/28/18   Brock Bad, MD    Family History Family History  Problem Relation Age of Onset  . Diabetes Father   . Diabetes Paternal Aunt   . Diabetes Paternal Grandmother   . Anesthesia problems Neg Hx     Social History Social History   Tobacco Use  . Smoking status: Never Smoker  . Smokeless tobacco: Never Used  Substance Use Topics  . Alcohol use: No  . Drug use: Yes    Types:  Marijuana    Comment: Occas. Last used past 2 days     Allergies   Penicillins and Percocet [oxycodone-acetaminophen]   Review of Systems Review of Systems  Constitutional: Negative for chills and fever.  Eyes: Negative for visual disturbance.  Respiratory: Negative for cough and shortness of breath.   Cardiovascular: Negative for chest pain.  Gastrointestinal: Negative for abdominal pain, nausea and vomiting.  Genitourinary: Positive for vaginal bleeding. Negative for dysuria, flank pain, hematuria and vaginal pain.  Musculoskeletal: Positive for arthralgias (L upper arm pain). Negative for myalgias.  Skin: Negative for color change, rash and wound.  Neurological: Negative for syncope and light-headedness.     Physical Exam Updated Vital Signs BP 102/80 (BP Location: Right Arm)   Pulse 80   Temp 98.8 F (37.1 C) (Oral)   Resp 16   Ht 5' 2.5" (1.588 m)   Wt 117.9 kg   LMP 09/16/2018   SpO2 100%   BMI 46.80 kg/m   Physical Exam Vitals signs and nursing note reviewed. Exam conducted with a chaperone present.  Constitutional:      General: She is not in acute distress.    Appearance: Normal appearance. She is well-developed. She is obese. She is not ill-appearing or diaphoretic.  HENT:     Head: Normocephalic and atraumatic.     Mouth/Throat:     Mouth: Mucous membranes are moist.     Pharynx: Oropharynx is clear.  Eyes:     General:        Right eye: No discharge.        Left eye: No discharge.  Neck:     Musculoskeletal: Neck supple.  Cardiovascular:     Rate and Rhythm: Normal rate and regular rhythm.     Pulses: Normal pulses.     Heart sounds: Normal heart sounds. No murmur. No friction rub. No gallop.   Pulmonary:     Effort: Pulmonary effort is normal. No respiratory distress.     Breath sounds: Normal breath sounds.     Comments: Respirations equal and unlabored, patient able to speak in full sentences, lungs clear to auscultation bilaterally  Abdominal:     General: Abdomen is flat. Bowel sounds are normal. There is no distension.     Palpations: Abdomen is soft. There is no mass.     Tenderness: There is no abdominal tenderness. There is no guarding.     Comments: Abdomen soft, nondistended, nontender to palpation in all quadrants without guarding or peritoneal signs  Genitourinary:    Comments: Heparin present during pelvic exam.  No external genital lesions noted. Small amount of blood in the vaginal vault, no active bleeding from the cervix, no clots present, no discharge noted.  On bimanual exam there is no cervical motion tenderness, no focal adnexal or uterine tenderness or palpable masses. Musculoskeletal:     Comments: Nexplanon implant palpable in the surface of the left upper inner arm, no overlying erythema or surrounding induration or fluctuance, the area is tender to palpation  Skin:    General: Skin is warm and dry.     Capillary Refill: Capillary refill takes less than 2 seconds.  Neurological:     Mental Status: She is alert and oriented to person, place, and time.     Coordination: Coordination normal.  Psychiatric:        Mood and Affect: Mood normal.        Behavior: Behavior normal.      ED Treatments / Results  Labs (all labs ordered are listed, but only abnormal results are displayed) Labs Reviewed  WET PREP, GENITAL - Abnormal; Notable for the following components:      Result Value   Clue Cells Wet Prep HPF POC PRESENT (*)    WBC, Wet Prep HPF POC FEW (*)    All other components within normal limits  BASIC METABOLIC PANEL  CBC  PREGNANCY, URINE  GC/CHLAMYDIA PROBE AMP [facility]    EKG None  Radiology No results found.  Procedures .Foreign Body Removal Date/Time: 09/16/2018 2:13 PM Performed by: Dartha LodgeFord, Lulia Schriner N, PA-C Authorized by: Dartha LodgeFord, Ophelia Sipe N, PA-C  Consent: Verbal consent obtained. Consent given by: patient Patient understanding: patient states understanding of  the procedure being performed Site marked: the operative site was marked Patient identity confirmed: verbally with patient Body area: skin General location: upper extremity Location details: left upper arm Anesthesia: local infiltration  Anesthesia: Local Anesthetic: lidocaine 2% with epinephrine  Sedation: Patient sedated: no  Patient restrained: no Patient cooperative: yes Localization method: probed Removal mechanism: scalpel Dressing: dressing applied Tendon involvement: none Depth: subcutaneous Complexity: simple 1 objects recovered. Objects recovered: Nexplanon implant Post-procedure assessment: foreign body removed Patient tolerance: Patient tolerated the procedure well with no immediate complications Comments: Nexplanon implant was removed from the left upper arm, implant was superficially palpable, the skin surrounding the distal end was anesthetized and a small parallel incision was made at the tip of the implant, this was then visible at the skin opening and was removed with forceps.   (including critical care time)  Medications Ordered in ED Medications  lidocaine-EPINEPHrine (XYLOCAINE W/EPI) 2 %-1:200000 (PF) injection 10 mL (has no administration in time range)     Initial Impression / Assessment and Plan / ED Course  I have reviewed the triage vital signs and the nursing notes.  Pertinent labs & imaging results that were available during my care of the patient were reviewed by me and considered in my medical decision making (see chart for details).  Patient presents with 2 months of persistent vaginal bleeding, as well as pain over the Nexplanon site in her left arm.  Is concerned that she is having breakthrough bleeding and that the Nexplanon implant is not working appropriately.  She is unable to see her OB/GYN to have this removed, her PCP has agreed to start her on oral OCPs if she can have this removed.  She has had some occasional lightheadedness but no  persistent symptoms but is concerned that bleeding has increased.  On arrival she has normal  vitals and is well-appearing and in no acute distress.  No abdominal tenderness on exam.  Small amount of bleeding but no heavy bleeding noted on exam and no palpable fibroids or masses.  Will check basic labs but given reassuring exam do not feel that ultrasound is indicated at this time.  Patient's Nexplanon implant is palpable superficially in the left upper arm she has tenderness over this area but there is no erythema swelling or signs of infection.  Patient desires to have this removed and given that she is unable to see her OB/GYN for almost a month I have discussed the risks and benefits of this procedure with the patient and she would like to have this removed today.  We will plan for local anesthesia and removal.  Distal end of the Nexplanon was anesthetized a small incision in the skin was made and the Nexplanon was easily removed with minimal bleeding and no complications.  Dressing was applied with warm compress.  I have counseled patient on applying warm compresses a few times a day to help prevent fluid pocket from forming as tissue heals, will leave small incision made open to allow for any drainage if need be.  Patient's labs are very reassuring she has normal hemoglobin, normal electrolytes, no leukocytosis, negative pregnancy.  Wet prep shows BV, GC chlamydia pending.  I discussed these reassuring results with the patient.  At this time she is stable for discharge home, prescription for Flagyl for BV provided.  Her PCP is going to start her on oral contraceptives and I have encouraged her to follow-up with her OB/GYN.  Return precautions discussed.  Discharged home in good condition.  Final Clinical Impressions(s) / ED Diagnoses   Final diagnoses:  Vaginal bleeding  Nexplanon removal  BV (bacterial vaginosis)    ED Discharge Orders    None       Dartha Lodge, [place] 09/16/18 1534     Derwood Kaplan, MD 09/17/18 [phone]

## 2018-09-19 LAB — GC/CHLAMYDIA PROBE AMP (~~LOC~~) NOT AT ARMC
Chlamydia: NEGATIVE
Neisseria Gonorrhea: NEGATIVE
